# Patient Record
Sex: Female | Born: 1935 | Race: White | Hispanic: No | Marital: Married | Smoking: Never smoker
Health system: Southern US, Community
[De-identification: ages and names within clinical notes are randomized; demographics above are authoritative.]

## PROBLEM LIST (undated history)

## (undated) DIAGNOSIS — E119 Type 2 diabetes mellitus without complications: Secondary | ICD-10-CM

## (undated) DIAGNOSIS — E039 Hypothyroidism, unspecified: Secondary | ICD-10-CM

## (undated) DIAGNOSIS — F0280 Dementia in other diseases classified elsewhere without behavioral disturbance: Secondary | ICD-10-CM

## (undated) DIAGNOSIS — I10 Essential (primary) hypertension: Secondary | ICD-10-CM

## (undated) DIAGNOSIS — G309 Alzheimer's disease, unspecified: Secondary | ICD-10-CM

## (undated) DIAGNOSIS — F0390 Unspecified dementia without behavioral disturbance: Secondary | ICD-10-CM

## (undated) HISTORY — PX: PACEMAKER INSERTION: SHX728

## (undated) HISTORY — PX: BREAST SURGERY: SHX581

---

## 1987-07-21 HISTORY — PX: NEPHRECTOMY: SHX65

## 2012-09-17 ENCOUNTER — Encounter (HOSPITAL_COMMUNITY): Payer: Self-pay | Admitting: Emergency Medicine

## 2012-09-17 ENCOUNTER — Emergency Department (HOSPITAL_COMMUNITY): Payer: Medicare Other

## 2012-09-17 ENCOUNTER — Observation Stay (HOSPITAL_COMMUNITY)
Admission: EM | Admit: 2012-09-17 | Discharge: 2012-09-18 | Disposition: A | Discharge status: Skilled Nursing Facility | Payer: Medicare Other | Attending: Internal Medicine | Admitting: Internal Medicine

## 2012-09-17 DIAGNOSIS — E039 Hypothyroidism, unspecified: Secondary | ICD-10-CM | POA: Insufficient documentation

## 2012-09-17 DIAGNOSIS — Z95810 Presence of automatic (implantable) cardiac defibrillator: Secondary | ICD-10-CM | POA: Diagnosis present

## 2012-09-17 DIAGNOSIS — R0602 Shortness of breath: Secondary | ICD-10-CM | POA: Insufficient documentation

## 2012-09-17 DIAGNOSIS — E119 Type 2 diabetes mellitus without complications: Secondary | ICD-10-CM | POA: Insufficient documentation

## 2012-09-17 DIAGNOSIS — F0390 Unspecified dementia without behavioral disturbance: Secondary | ICD-10-CM | POA: Diagnosis present

## 2012-09-17 DIAGNOSIS — I10 Essential (primary) hypertension: Secondary | ICD-10-CM | POA: Diagnosis present

## 2012-09-17 DIAGNOSIS — R079 Chest pain, unspecified: Principal | ICD-10-CM | POA: Insufficient documentation

## 2012-09-17 DIAGNOSIS — Z950 Presence of cardiac pacemaker: Secondary | ICD-10-CM | POA: Insufficient documentation

## 2012-09-17 HISTORY — DX: Type 2 diabetes mellitus without complications: E11.9

## 2012-09-17 HISTORY — DX: Hypothyroidism, unspecified: E03.9

## 2012-09-17 HISTORY — DX: Essential (primary) hypertension: I10

## 2012-09-17 HISTORY — DX: Unspecified dementia, unspecified severity, without behavioral disturbance, psychotic disturbance, mood disturbance, and anxiety: F03.90

## 2012-09-17 LAB — CBC WITH DIFFERENTIAL/PLATELET
Basophils Absolute: 0.1 10*3/uL (ref 0.0–0.1)
Basophils Relative: 1 % (ref 0–1)
Eosinophils Relative: 2 % (ref 0–5)
HCT: 36.4 % (ref 36.0–46.0)
Hemoglobin: 12.4 g/dL (ref 12.0–15.0)
Lymphocytes Relative: 24 % (ref 12–46)
Lymphs Abs: 2.1 10*3/uL (ref 0.7–4.0)
MCV: 85.4 fL (ref 78.0–100.0)
Monocytes Absolute: 0.7 10*3/uL (ref 0.1–1.0)
Monocytes Relative: 9 % (ref 3–12)
Neutro Abs: 5.6 10*3/uL (ref 1.7–7.7)
WBC: 8.6 10*3/uL (ref 4.0–10.5)

## 2012-09-17 LAB — BASIC METABOLIC PANEL
BUN: 15 mg/dL (ref 6–23)
CO2: 23 mEq/L (ref 19–32)
Chloride: 107 mEq/L (ref 96–112)
GFR calc Af Amer: 45 mL/min — ABNORMAL LOW (ref >90)
Potassium: 3.7 mEq/L (ref 3.5–5.1)

## 2012-09-17 LAB — URINALYSIS, ROUTINE W REFLEX MICROSCOPIC
Bilirubin Urine: NEGATIVE
Hgb urine dipstick: NEGATIVE
Ketones, ur: NEGATIVE mg/dL
Nitrite: NEGATIVE
Protein, ur: NEGATIVE mg/dL
Urobilinogen, UA: 0.2 mg/dL (ref 0.0–1.0)

## 2012-09-17 LAB — TROPONIN I
Troponin I: <0.3 ng/mL (ref <0.30)
Troponin I: <0.3 ng/mL (ref <0.30)

## 2012-09-17 LAB — LIPID PANEL: Cholesterol: 173 mg/dL (ref 0–200)

## 2012-09-17 MED ORDER — NITROGLYCERIN 0.4 MG SL SUBL: 0.4000 mg | SUBLINGUAL_TABLET | SUBLINGUAL | Status: DC | PRN

## 2012-09-17 MED ORDER — SODIUM CHLORIDE 0.9 % IJ SOLN: 3.0000 mL | Freq: Two times a day (BID) | INTRAMUSCULAR | Status: DC

## 2012-09-17 MED ORDER — SODIUM CHLORIDE 0.9 % IV SOLN: 250.0000 mL | INTRAVENOUS | Status: DC | PRN

## 2012-09-17 MED ORDER — CLONAZEPAM 1 MG PO TABS
1.0000 mg | ORAL_TABLET | Freq: Every day | ORAL | Status: DC
  Administered 2012-09-17: 1 mg via ORAL
  Filled 2012-09-17: qty 1

## 2012-09-17 MED ORDER — MORPHINE SULFATE 2 MG/ML IJ SOLN: 2.0000 mg | INTRAMUSCULAR | Status: DC | PRN

## 2012-09-17 MED ORDER — INSULIN ASPART 100 UNIT/ML ~~LOC~~ SOLN
0.0000 [IU] | Freq: Three times a day (TID) | SUBCUTANEOUS | Status: DC
  Administered 2012-09-18 (×2): 2 [IU] via SUBCUTANEOUS

## 2012-09-17 MED ORDER — METOPROLOL TARTRATE 25 MG PO TABS
25.0000 mg | ORAL_TABLET | Freq: Two times a day (BID) | ORAL | Status: DC
  Administered 2012-09-17 – 2012-09-18 (×2): 25 mg via ORAL
  Filled 2012-09-17 (×3): qty 1

## 2012-09-17 MED ORDER — DONEPEZIL HCL 10 MG PO TABS
10.0000 mg | ORAL_TABLET | Freq: Every day | ORAL | Status: DC
  Administered 2012-09-17: 10 mg via ORAL
  Filled 2012-09-17 (×2): qty 1

## 2012-09-17 MED ORDER — INSULIN ASPART 100 UNIT/ML ~~LOC~~ SOLN: 0.0000 [IU] | SUBCUTANEOUS | Status: DC

## 2012-09-17 MED ORDER — PANTOPRAZOLE SODIUM 40 MG PO TBEC
40.0000 mg | DELAYED_RELEASE_TABLET | Freq: Every day | ORAL | Status: DC
  Administered 2012-09-18: 40 mg via ORAL
  Filled 2012-09-17: qty 1

## 2012-09-17 MED ORDER — MEMANTINE HCL 10 MG PO TABS
10.0000 mg | ORAL_TABLET | Freq: Two times a day (BID) | ORAL | Status: DC
  Administered 2012-09-17 – 2012-09-18 (×2): 10 mg via ORAL
  Filled 2012-09-17 (×3): qty 1

## 2012-09-17 MED ORDER — AMLODIPINE BESYLATE 5 MG PO TABS
5.0000 mg | ORAL_TABLET | Freq: Every day | ORAL | Status: DC
  Administered 2012-09-17 – 2012-09-18 (×2): 5 mg via ORAL
  Filled 2012-09-17 (×2): qty 1

## 2012-09-17 MED ORDER — INSULIN GLARGINE 100 UNIT/ML ~~LOC~~ SOLN
5.0000 [IU] | Freq: Every day | SUBCUTANEOUS | Status: DC
  Administered 2012-09-17: 5 [IU] via SUBCUTANEOUS

## 2012-09-17 MED ORDER — ASPIRIN EC 81 MG PO TBEC
81.0000 mg | DELAYED_RELEASE_TABLET | Freq: Every day | ORAL | Status: DC
  Administered 2012-09-17 – 2012-09-18 (×2): 81 mg via ORAL
  Filled 2012-09-17 (×2): qty 1

## 2012-09-17 MED ORDER — MAGNESIUM HYDROXIDE 400 MG/5ML PO SUSP: 30.0000 mL | Freq: Every day | ORAL | Status: DC | PRN

## 2012-09-17 MED ORDER — SODIUM CHLORIDE 0.9 % IJ SOLN
3.0000 mL | Freq: Two times a day (BID) | INTRAMUSCULAR | Status: DC
  Administered 2012-09-18: 3 mL via INTRAVENOUS

## 2012-09-17 MED ORDER — LEVOTHYROXINE SODIUM 100 MCG PO TABS
100.0000 ug | ORAL_TABLET | Freq: Every day | ORAL | Status: DC
  Administered 2012-09-18: 100 ug via ORAL
  Filled 2012-09-17 (×2): qty 1

## 2012-09-17 MED ORDER — SERTRALINE HCL 25 MG PO TABS
25.0000 mg | ORAL_TABLET | Freq: Every day | ORAL | Status: DC
  Administered 2012-09-18: 25 mg via ORAL
  Filled 2012-09-17: qty 1

## 2012-09-17 MED ORDER — RIVAROXABAN 20 MG PO TABS
20.0000 mg | ORAL_TABLET | Freq: Every day | ORAL | Status: DC
  Administered 2012-09-17: 20 mg via ORAL
  Filled 2012-09-17 (×2): qty 1

## 2012-09-17 MED ORDER — SODIUM CHLORIDE 0.9 % IJ SOLN: 3.0000 mL | INTRAMUSCULAR | Status: DC | PRN

## 2012-09-17 MED ORDER — ACETAMINOPHEN 500 MG PO TABS
500.0000 mg | ORAL_TABLET | ORAL | Status: DC | PRN
  Filled 2012-09-17: qty 1

## 2012-09-17 NOTE — ED Notes (Signed)
Pt presents to ED via ems with c/o left sided chest pain that started yesterday while eating. Pt took asa at nursing home that relieved chest pain. EMS placed 20g to left hand. Patient pain free after aspirin. NAD.

## 2012-09-17 NOTE — H&P (Signed)
Hospital Admission Note Date: 09/17/2012  Patient name: Sarah Yang Medical record number: [id number] Date of birth: 1934/10/20 Age: 78 y.o. Gender: female PCP: Dr. Cathey Endow Medical Service: Internal Medicine Teaching Service  Attending physician:  Dr. Criselda Peaches   1st Contact: Dr. Garald Braver   Pager:[phone] 2nd Contact: Dr. Clyde Lundborg    Pager:[phone] After 5 pm or weekends: 1st Contact:      Pager: [phone] 2nd Contact:      Pager: [phone]  Chief Complaint: Chest pain  History of Present Illness: Sarah Yang is a 78 year old woman with PMH of dementia, pacemaker implant for symptomatic bradycardia, DM2, HTN, history of breast cancer s/p bilateral mastectomy and on remission, history of recurrent LE DVT, and hypothyroidism who comes in for evaluation of chest pain. She is currently a nursing home resident with dementia and is unable to remember the details of event for today. Her daughter is at her bedside and provides most of the information during this interview. [name] was in her usual state of health until this morning, around 10 AM when she complained of sudden chest pain. Her chest pain was described as sharp, noted to be in her left chest, lasting for 10 minutes and reported as 10/10. There was no observed diaphoresis and Ms. Mahurin is unable to remember if the pain radiated to her arm, neck or jaw, or if she had shortness of breath or nausea associated with the pain. The pain subsided on its own. EMS was called to her facility, she was given ASA and brought to the [facility] ED for further evaluation. She denies recurrent chest pain. According to her daughter Sarah Yang has never had chest pain, coronary artery disease, or history of MI. She had recurrent syncope years ago which required pacemaker placement in 2012 with no symptomatic bradycardia since then.   Her Cardiologist id Dr. Jeanne Ivan in [facility]. Her former PCP of more than 40 years is Dr. Levora Angel. She has been seen by Dr. Cathey Endow recently  after moving into her nursing home facility in 07/29/2012.   Meds: Current Outpatient Rx  Name  Route  Sig  Dispense  Refill  . acetaminophen (TYLENOL) 500 MG tablet   Oral   Take 500 mg by mouth every 4 (four) hours as needed for pain (Max dose is 300 mg/24 hrs).         . ciprofloxacin (CIPRO) 250 MG tablet   Oral   Take 250 mg by mouth 2 (two) times daily. For 10 days started 09-08-12         . clonazePAM (KLONOPIN) 1 MG tablet   Oral   Take 1 mg by mouth at bedtime.         . donepezil (ARICEPT) 10 MG tablet   Oral   Take 10 mg by mouth at bedtime.         . insulin glargine (LANTUS) 100 UNIT/ML injection   Subcutaneous   Inject 5 Units into the skin at bedtime.         Marland Kitchen levothyroxine (SYNTHROID, LEVOTHROID) 100 MCG tablet   Oral   Take 100 mcg by mouth daily.         . magnesium hydroxide (MILK OF MAGNESIA) 400 MG/5ML suspension   Oral   Take 30 mLs by mouth daily as needed for constipation.         . memantine (NAMENDA) 10 MG tablet   Oral   Take 10 mg by mouth 2 (two) times daily.         Marland Kitchen  metoprolol tartrate (LOPRESSOR) 25 MG tablet   Oral   Take 25 mg by mouth 2 (two) times daily.         . Rivaroxaban (XARELTO) 20 MG TABS   Oral   Take 20 mg by mouth daily.         . sertraline (ZOLOFT) 25 MG tablet   Oral   Take 25 mg by mouth daily.           Allergies: Allergies as of 09/17/2012 - Review Complete 09/17/2012  Allergen Reaction Noted  . Latex Rash 09/17/2012   Past Medical History  Diagnosis Date  . Diabetes mellitus without complication   . Hypertension   . Dementia   . Hypothyroidism    Past Surgical History  Procedure Laterality Date  . Pacemaker insertion     No family history on file. History   Social History  . Marital Status: Married    Spouse Name: N/A    Number of Children: N/A  . Years of Education: N/A   Occupational History  . Not on file.   Social History Main Topics  . Smoking status: Never  Smoker   . Smokeless tobacco: Not on file  . Alcohol Use: No  . Drug Use: No  . Sexually Active: Not on file   Other Topics Concern  . Not on file   Social History Narrative  . No narrative on file    Review of Systems: Review of systems not obtained due to patient factors.  Physical Exam: Blood pressure 147/95, pulse 53, temperature 97.3 F (36.3 C), temperature source Oral, resp. rate 18, SpO2 100.00%. Vitals reviewed. General: resting in bed, in NAD, calm and cooperative to exam HEENT: PERRL, no scleral icterus Cardiac: Distant heart sounds secondary to body habitus (moderate kyphosis) no rubs, murmurs or gallops Pulm: clear to auscultation bilaterally, no wheezes, rales, or rhonchi MSK: Moderate kyphosis, genu varum, b/l knees with no effusion, no erythema, not TTP Abd: soft, nontender, nondistended, BS present Ext: warm and well perfused, no pedal edema. No calf pain, edema, or erythema b/l Neuro: alert oriented to person (her daughter), cranial nerves II-XII grossly intact, sensation to light touch equal in bilateral upper and lower extremities, moves 4 extremities spontaneously   Lab results: Basic Metabolic Panel:  Recent Labs  29/56/21 1127  NA 140  K 3.7  CL 107  CO2 23  GLUCOSE 175*  BUN 15  CREATININE 1.31*  CALCIUM 9.3   CBC:  Recent Labs  09/17/12 1127  WBC 8.6  NEUTROABS 5.6  HGB 12.4  HCT 36.4  MCV 85.4  PLT 172   Cardiac Enzymes:  Recent Labs  09/17/12 1132  TROPONINI <0.30   Imaging results:  Dg Chest 2 View  09/17/2012  *RADIOLOGY REPORT*  Clinical Data: Cough.  CHEST - 2 VIEW  Comparison: None.  Findings: Mild hyperinflation of the lungs.  Left pacer in place with leads in the right atrium and right ventricle.  Heart is normal size.  Lungs clear.  No effusions.  Diffuse degenerative changes throughout the thoracic spine with kyphosis.  No acute bony abnormality.  IMPRESSION: Mild hyperinflation.  No acute cardiopulmonary disease.    Original Report Authenticated By: Charlett Nose, M.D.     Other results: EKG: Atrial paced complexes, no ST elevation or depression.   Assessment & Plan by Problem:  Chest pain. Etiology unclear. Her risk factors for ACS are DM2, HTN, and age. Her TIMI score is 2 with 8% risk at  14 day for all cause mortality including from MI. Differential to include ACS (unlikely at this time with no hx of CAD, no EKG changes, and first troponin negative), PE (she does have hx of DVTs but she is on chronic anticoagulation therapy with [place], she does not have tachycardia), pacemaker firing (she denies recent bradycardia or syncope, troponin negative), GERD (she does have occasional heartburn but is not on chronic PPI). UTI could explain acute pain that could have been misinterpreted as CP given the patient's dementia and her family reports hx of UTI last week that was treated with Cipro with last dose of today.  -Observation status for MI rule out -ASA 81 mg daily -EKG in AM -troponin q6h -Risk factor stratification with LDL, Hgb A1C, and TSH -O2 supplementation for with goal saturation >92%   Recent UTI. Pt was diagnosed with UTI last week with cipro tx, last pill today.  -UA and urine culture  HTN. BP elevated today to 184/101, come down to 147/95. Pt received her Lopressor 25mg  in AM.  -Continue BB  DM2. She is on Lantus 5 units qHS, glipizide 10mg , and Novolog 3 units TID ac and SSI.  -Lantus 5 units  -SSI sensitive -Checking HgbA1C  Hypothyroidism.  -Continue home synthroid, daily -Check TSH  Dementia. Unclear if Alzeheimer's disease or vascular dementia. She is currently at SNF/memory care wing.  -Continue Namenda, Zoloft, and Aricept -Klonopin 1mg  qHS  History of recurrent bilateral DVT. No recent DVT, pt was on coumadin therapy in the past but now on Xeralto due to dementia.  -Continue Xeralto.  VTE prophylaxis: SCDs, on Xeralto  FEN:  NSL Replete as needed, goal K of 4, Mg  of 2 Carb mod heart healthy diet  Dispo: Disposition is deferred at this time, awaiting improvement of current medical problems. Anticipated discharge in approximately 1-2 day(s).   The patient has a current PCP Dr. Cathey Endow, therefore will not be requiring OPC follow-up after discharge.   The patient does not have transportation limitations that hinder transportation to clinic appointments.  Signed: Ky Barban 09/17/2012, 3:26 PM

## 2012-09-17 NOTE — ED Provider Notes (Signed)
History     CSN: [id number]  Arrival date & time 09/17/12  1056   First MD Initiated Contact with Patient 09/17/12 1114      Chief Complaint  Patient presents with  . Chest Pain     Patient is a 78 y.o. female presenting with chest pain. The history is provided by the EMS personnel, the nursing home and a relative. The history is limited by the condition of the patient (Hx dementia).  Chest Pain  Pt was seen at 1125.   Per EMS, NH report and family, c/o gradual onset and resolution of one episode of chest pain and SOB that began this morning approx 10am.  Pt was given ASA by the NH staff with relief of discomfort.  Pt has significant hx of dementia and does not recall events from this morning.     Past Medical History  Diagnosis Date  . Diabetes mellitus without complication   . Hypertension   . Dementia     Past Surgical History  Procedure Laterality Date  . Pacemaker insertion        History  Substance Use Topics  . Smoking status: Never Smoker   . Smokeless tobacco: Not on file  . Alcohol Use: No      Review of Systems  Unable to perform ROS: Dementia  Cardiovascular: Positive for chest pain.    Allergies  Latex  Home Medications  No current outpatient prescriptions on file.  BP 159/92  Pulse 88  Temp(Src) 97.3 F (36.3 C) (Oral)  Resp 16  SpO2 99%  Physical Exam 1130: Physical examination:  Nursing notes reviewed; Vital signs and O2 SAT reviewed;  Constitutional: Well developed, Well nourished, In no acute distress; Head:  Normocephalic, atraumatic; Eyes: EOMI, PERRL, No scleral icterus; ENMT: Mouth and pharynx normal, Mucous membranes dry; Neck: Supple, Full range of motion, No lymphadenopathy; Cardiovascular: Regular rate and rhythm, No gallop; Respiratory: Breath sounds clear & equal bilaterally, No rales, rhonchi, wheezes.  Speaking full sentences with ease, Normal respiratory effort/excursion; Chest: Nontender, Movement normal; Abdomen: Soft,  Nontender, Nondistended, Normal bowel sounds;; Extremities: Pulses normal, No tenderness, No edema, No calf edema or asymmetry.; Neuro: Awake, alert, confused re: time, place, events per hx dementia. Major CN grossly intact. No facial droop. Speech clear. Moves all ext spontaneously without apparent gross focal motor deficits.; Skin: Color normal, Warm, Dry.   ED Course  Procedures   1130:  Pt's family is at bedside. They state that they work at the NH and pt did not have CP yesterday.  State the CP began this morning, they were told about it, and had NH transport pt to the ED today.   MDM  MDM Reviewed: previous chart, nursing note and vitals Reviewed previous: labs Interpretation: ECG, labs and x-ray    Date: 09/17/2012  Rate: 84  Rhythm: normal sinus rhythm, atrial paced complexes  QRS Axis: normal  Intervals: normal  ST/T Wave abnormalities: normal  Conduction Disutrbances:none  Narrative Interpretation:   Old EKG Reviewed: none available    Results for orders placed during the hospital encounter of 09/17/12  BASIC METABOLIC PANEL      Result Value Range   Sodium 140  135 - 145 mEq/L   Potassium 3.7  3.5 - 5.1 mEq/L   Chloride 107  96 - 112 mEq/L   CO2 23  19 - 32 mEq/L   Glucose, Bld 175 (*) 70 - 99 mg/dL   BUN 15  6 - 23 mg/dL  Creatinine, Ser 1.31 (*) 0.50 - 1.10 mg/dL   Calcium 9.3  8.4 - 16.1 mg/dL   GFR calc non Af Amer 39 (*) >90 mL/min   GFR calc Af Amer 45 (*) >90 mL/min  CBC WITH DIFFERENTIAL      Result Value Range   WBC 8.6  4.0 - 10.5 K/uL   RBC 4.26  3.87 - 5.11 MIL/uL   Hemoglobin 12.4  12.0 - 15.0 g/dL   HCT 09.6  04.5 - 40.9 %   MCV 85.4  78.0 - 100.0 fL   MCH 29.1  26.0 - 34.0 pg   MCHC 34.1  30.0 - 36.0 g/dL   RDW 81.1  91.4 - 78.2 %   Platelets 172  150 - 400 K/uL   Neutrophils Relative 65  43 - 77 %   Neutro Abs 5.6  1.7 - 7.7 K/uL   Lymphocytes Relative 24  12 - 46 %   Lymphs Abs 2.1  0.7 - 4.0 K/uL   Monocytes Relative 9  3 - 12 %    Monocytes Absolute 0.7  0.1 - 1.0 K/uL   Eosinophils Relative 2  0 - 5 %   Eosinophils Absolute 0.1  0.0 - 0.7 K/uL   Basophils Relative 1  0 - 1 %   Basophils Absolute 0.1  0.0 - 0.1 K/uL  TROPONIN I      Result Value Range   Troponin I <0.30  <0.30 ng/mL   Dg Chest 2 View 09/17/2012  *RADIOLOGY REPORT*  Clinical Data: Cough.  CHEST - 2 VIEW  Comparison: None.  Findings: Mild hyperinflation of the lungs.  Left pacer in place with leads in the right atrium and right ventricle.  Heart is normal size.  Lungs clear.  No effusions.  Diffuse degenerative changes throughout the thoracic spine with kyphosis.  No acute bony abnormality.  IMPRESSION: Mild hyperinflation.  No acute cardiopulmonary disease.   Original Report Authenticated By: Charlett Nose, M.D.     1415:  Pt continues to deny CP while in the ED.  Multiple cardiac risk factors, will observation admit. Dx and testing d/w pt and family.  Questions answered.  Verb understanding, agreeable to observation admit. T/C to [facility] Resident, case discussed, including:  HPI, pertinent PM/SHx, VS/PE, dx testing, ED course and treatment:  Agreeable to observation admit, requests they will come to ED for eval.          Laray Anger, DO 09/20/12 1337

## 2012-09-17 NOTE — ED Notes (Signed)
Pt assisted to bedside commode. Placed back on cardiac monitor and continuous pulse ox.

## 2012-09-18 DIAGNOSIS — R079 Chest pain, unspecified: Secondary | ICD-10-CM

## 2012-09-18 LAB — GLUCOSE, CAPILLARY
Glucose-Capillary: 153 mg/dL — ABNORMAL HIGH (ref 70–99)
Glucose-Capillary: 199 mg/dL — ABNORMAL HIGH (ref 70–99)

## 2012-09-18 LAB — COMPREHENSIVE METABOLIC PANEL
ALT: 9 U/L (ref 0–35)
Alkaline Phosphatase: 51 U/L (ref 39–117)
Chloride: 106 mEq/L (ref 96–112)
GFR calc Af Amer: 48 mL/min — ABNORMAL LOW (ref >90)
Glucose, Bld: 121 mg/dL — ABNORMAL HIGH (ref 70–99)
Potassium: 3.7 mEq/L (ref 3.5–5.1)
Sodium: 140 mEq/L (ref 135–145)
Total Bilirubin: 0.7 mg/dL (ref 0.3–1.2)
Total Protein: 6.4 g/dL (ref 6.0–8.3)

## 2012-09-18 LAB — TSH: TSH: 5.258 u[IU]/mL — ABNORMAL HIGH (ref 0.350–4.500)

## 2012-09-18 LAB — URINE CULTURE

## 2012-09-18 LAB — HEMOGLOBIN A1C
Hgb A1c MFr Bld: 7.2 % — ABNORMAL HIGH (ref <5.7)
Mean Plasma Glucose: 160 mg/dL — ABNORMAL HIGH (ref <117)

## 2012-09-18 LAB — TROPONIN I
Troponin I: <0.3 ng/mL (ref <0.30)
Troponin I: <0.3 ng/mL (ref <0.30)

## 2012-09-18 LAB — CBC
Hemoglobin: 12.6 g/dL (ref 12.0–15.0)
MCHC: 33.8 g/dL (ref 30.0–36.0)
RBC: 4.38 MIL/uL (ref 3.87–5.11)
WBC: 8 10*3/uL (ref 4.0–10.5)

## 2012-09-18 MED ORDER — ASPIRIN 81 MG PO TBEC: 81.0000 mg | DELAYED_RELEASE_TABLET | Freq: Every day | ORAL | Status: DC

## 2012-09-18 MED ORDER — PANTOPRAZOLE SODIUM 40 MG PO TBEC: 40.0000 mg | DELAYED_RELEASE_TABLET | Freq: Every day | ORAL | Status: DC

## 2012-09-18 MED ORDER — NITROGLYCERIN 0.4 MG SL SUBL: 0.4000 mg | SUBLINGUAL_TABLET | SUBLINGUAL | Status: DC | PRN

## 2012-09-18 NOTE — Discharge Summary (Signed)
Internal Medicine Teaching [facility] Discharge Note  Name: Sarah Yang MRN: [id number] DOB: Apr 14, 1935 78 y.o.  Date of Admission: 09/17/2012 10:56 AM Date of Discharge: 09/18/2012 Attending Physician: Inez Catalina, MD  Discharge Diagnosis: Principal Problem:   Chest pain Active Problems:   Dementia   DM2 (diabetes mellitus, type 2)   Hypertension   Hypothyroidism   Cardiac pacemaker   Discharge Medications:   Medication List    STOP taking these medications       glipiZIDE 10 MG tablet  Commonly known as:  GLUCOTROL     NOVOLIN R 100 units/mL injection  Generic drug:  insulin regular      TAKE these medications       acetaminophen 500 MG tablet  Commonly known as:  TYLENOL  Take 500 mg by mouth every 4 (four) hours as needed for pain (Max dose is 300 mg/24 hrs).     aspirin 81 MG EC tablet  Take 1 tablet (81 mg total) by mouth daily.     ciprofloxacin 250 MG tablet  Commonly known as:  CIPRO  Take 250 mg by mouth 2 (two) times daily. For 10 days started 09-08-12     clonazePAM 1 MG tablet  Commonly known as:  KLONOPIN  Take 1 mg by mouth at bedtime.     donepezil 10 MG tablet  Commonly known as:  ARICEPT  Take 10 mg by mouth at bedtime.     insulin glargine 100 UNIT/ML injection  Commonly known as:  LANTUS  Inject 5 Units into the skin at bedtime.     levothyroxine 100 MCG tablet  Commonly known as:  SYNTHROID, LEVOTHROID  Take 100 mcg by mouth daily.     magnesium hydroxide 400 MG/5ML suspension  Commonly known as:  MILK OF MAGNESIA  Take 30 mLs by mouth daily as needed for constipation.     memantine 10 MG tablet  Commonly known as:  NAMENDA  Take 10 mg by mouth 2 (two) times daily.     metoprolol tartrate 25 MG tablet  Commonly known as:  LOPRESSOR  Take 25 mg by mouth 2 (two) times daily.     nitroGLYCERIN 0.4 MG SL tablet  Commonly known as:  NITROSTAT  Place 1 tablet (0.4 mg total) under the tongue every 5 (five) minutes as  needed for chest pain.     pantoprazole 40 MG tablet  Commonly known as:  PROTONIX  Take 1 tablet (40 mg total) by mouth daily at 12 noon.     sertraline 25 MG tablet  Commonly known as:  ZOLOFT  Take 25 mg by mouth daily.     XARELTO 20 MG Tabs  Generic drug:  Rivaroxaban  Take 20 mg by mouth daily.        Disposition and follow-up:   Sarah Yang was discharged from [facility] in Good condition.  At the hospital follow up visit please address: -Repeat TSH, she may need medication adjustment -She will need follow up with her Cardiologist for pacemaker interrogation--her family was not sure of the name of the manufacturer for her pacemaker -She may need repeat BP checks and another anti-hypertensive -Would consider decreasing dose of Xeralto to 15mg  daily based on Cr Cl of 15-46ml/min.  Follow-up Appointments: Follow-up Information   Follow up with With your PCP as soon as possible.      Schedule an appointment as soon as possible for a visit with Sandy Salaam, MD. (For further  evaluation of chest pain and for pacemaker interrogation)    Contact information:   [address] [place] [address] [phone]      Discharge Orders   Future Orders Complete By Expires     Diet - low sodium heart healthy  As directed     Increase activity slowly  As directed        Consultations:  None  Procedures Performed:  Dg Chest 2 View  09/17/2012  *RADIOLOGY REPORT*  Clinical Data: Cough.  CHEST - 2 VIEW  Comparison: None.  Findings: Mild hyperinflation of the lungs.  Left pacer in place with leads in the right atrium and right ventricle.  Heart is normal size.  Lungs clear.  No effusions.  Diffuse degenerative changes throughout the thoracic spine with kyphosis.  No acute bony abnormality.  IMPRESSION: Mild hyperinflation.  No acute cardiopulmonary disease.   Original Report Authenticated By: Charlett Nose, M.D.     Admission HPI:  Sarah Yang  is a 78 year old woman with PMH of dementia, pacemaker implant for symptomatic bradycardia, DM2, HTN, history of breast cancer s/p bilateral mastectomy and on remission, history of recurrent LE DVT, and hypothyroidism who comes in for evaluation of chest pain. She is currently a nursing home resident with dementia and is unable to remember the details of event for today. Her daughter is at her bedside and provides most of the information during this interview. Sarah Yang was in her usual state of health until this morning, around 10 AM when she complained of sudden chest pain. Her chest pain was described as sharp, noted to be in her left chest, lasting for 10 minutes and reported as 10/10. There was no observed diaphoresis and Sarah Yang is unable to remember if the pain radiated to her arm, neck or jaw, or if she had shortness of breath or nausea associated with the pain. The pain subsided on its own. EMS was called to her facility, she was given ASA and brought to the [facility] ED for further evaluation. She denies recurrent chest pain. According to her daughter, Sarah Yang has never had chest pain, coronary artery disease, or history of MI. She had recurrent syncope years ago which required pacemaker placement in 2012 with no symptomatic bradycardia since then.  Her Cardiologist is Dr. Jeanne Ivan in [facility]. Her former PCP of more than 40 years is Dr. Levora Angel. She has been seen by Dr. Cathey Endow recently after moving into her nursing home facility in 07/29/2012.   Hospital Course by problem list: Atypical Chest pain. She had one episode of acute chest pain on presentation and was placed in inpatient observation overnight for ACS rule out. Her troponin cycled every 6 hours times 3 is negative. Her initial EKG had atrial pacing with no ST/T wave abnormalities. Repeat EKG with nonspecific T wave changes not consistent with MI. Her chest pain has resolved on it sown with no recurrent chest pain overnight. The likely etiology  for her chest pain is GERD but could also be secondary to anxiety or coronary vasospasm. Her LDL of 88, meets the goal of <100. Her daughter was encouraged to make an outpatient follow up appointment with Cardiology for further evaluation of chest pain and for pacemaker interrogation--pacemaker manufacturer unknown to the patient and her daughter, interrogation could not be done while inpatient but hopefully her Cardiologist has this information.   Recent UTI. She was diagnosed with UTI last week with ciprofloxacin treatment for 10 days and  finished the course on 09/17/12. Her UA is leukoesterase negative and nitrite negative. Urine culture pending upon her discharge. She denies dysuria or increased frequency.   HTN. BP elevated mildly elevated to 143/62 this morning. She was briefly treated with Norvasc 5mg  in addition to Lopressor. She may need continuation of Norvasc as outpatient if her blood pressure is persistentally elevated. We will continue Lopressor upon her discharge.   DM2. She is on Lantus 5 units qHS, glipizide 10mg , and Novolog 3 units TID ac and SSI. HgbA1C of 7.2%, DM2 not at goal of below 7% but controlled. She will resume her home regimen upon her discharge.    Hypothyroidism. TSH only mildly elevated to 5.258. Continue home synthroid, daily. She may need mild recheck TSH as outpatient and medication adjustment.   Dementia. Unclear if Alzeheimer's disease or vascular dementia. She is currently at SNF/memory care wing. We continued her Namenda, Zoloft, and Aricept as well as Klonopin 1mg  qHS. She will resume these medications upon her discharge.    History of recurrent bilateral DVT. No recent DVT. She was on coumadin therapy in the past but is now on Xeralto due to dementia. We continued Xeralto 20mg  daily but the dose of this medication could be adjusted to 15mg  daily in the outpatient setting based on her Creatinine Clearance of 15-61ml/min.   Discharge Vitals:  BP 143/62   Pulse 86  Temp(Src) 97.7 F (36.5 C) (Oral)  Resp 16  Ht 5\' 5"  (1.651 m)  Wt 131 lb 11.2 oz (59.739 kg)  BMI 21.92 kg/m2  SpO2 100%  Discharge Labs:  Results for orders placed during the hospital encounter of 09/17/12 (from the past 24 hour(s))  URINALYSIS, ROUTINE W REFLEX MICROSCOPIC     Status: Abnormal   Collection Time    09/17/12  4:51 PM      Result Value Range   Color, Urine YELLOW  YELLOW   APPearance CLEAR  CLEAR   Specific Gravity, Urine 1.019  1.005 - 1.030   pH 5.5  5.0 - 8.0   Glucose, UA 100 (*) NEGATIVE mg/dL   Hgb urine dipstick NEGATIVE  NEGATIVE   Bilirubin Urine NEGATIVE  NEGATIVE   Ketones, ur NEGATIVE  NEGATIVE mg/dL   Protein, ur NEGATIVE  NEGATIVE mg/dL   Urobilinogen, UA 0.2  0.0 - 1.0 mg/dL   Nitrite NEGATIVE  NEGATIVE   Leukocytes, UA NEGATIVE  NEGATIVE  GLUCOSE, CAPILLARY     Status: Abnormal   Collection Time    09/17/12  5:04 PM      Result Value Range   Glucose-Capillary 117 (*) 70 - 99 mg/dL   Comment 1 Notify RN    LIPID PANEL     Status: Abnormal   Collection Time    09/17/12  5:05 PM      Result Value Range   Cholesterol 173  0 - 200 mg/dL   Triglycerides 295 (*) <150 mg/dL   HDL 44  >62 mg/dL   Total CHOL/HDL Ratio 3.9     VLDL 41 (*) 0 - 40 mg/dL   LDL Cholesterol 88  0 - 99 mg/dL  HEMOGLOBIN Z3Y     Status: Abnormal   Collection Time    09/17/12  5:05 PM      Result Value Range   Hemoglobin A1C 7.2 (*) <5.7 %   Mean Plasma Glucose 160 (*) <117 mg/dL  TSH     Status: Abnormal   Collection Time    09/17/12  5:05  PM      Result Value Range   TSH 5.258 (*) 0.350 - 4.500 uIU/mL  TROPONIN I     Status: None   Collection Time    09/17/12  5:05 PM      Result Value Range   Troponin I <0.30  <0.30 ng/mL  MRSA PCR SCREENING     Status: None   Collection Time    09/17/12  5:54 PM      Result Value Range   MRSA by PCR NEGATIVE  NEGATIVE  GLUCOSE, CAPILLARY     Status: Abnormal   Collection Time    09/17/12  8:27 PM       Result Value Range   Glucose-Capillary 183 (*) 70 - 99 mg/dL  TROPONIN I     Status: None   Collection Time    09/17/12 11:47 PM      Result Value Range   Troponin I <0.30  <0.30 ng/mL  TROPONIN I     Status: None   Collection Time    09/18/12  5:36 AM      Result Value Range   Troponin I <0.30  <0.30 ng/mL  CBC     Status: None   Collection Time    09/18/12  5:36 AM      Result Value Range   WBC 8.0  4.0 - 10.5 K/uL   RBC 4.38  3.87 - 5.11 MIL/uL   Hemoglobin 12.6  12.0 - 15.0 g/dL   HCT 45.4  09.8 - 11.9 %   MCV 85.2  78.0 - 100.0 fL   MCH 28.8  26.0 - 34.0 pg   MCHC 33.8  30.0 - 36.0 g/dL   RDW 14.7  82.9 - 56.2 %   Platelets 185  150 - 400 K/uL  COMPREHENSIVE METABOLIC PANEL     Status: Abnormal   Collection Time    09/18/12  5:36 AM      Result Value Range   Sodium 140  135 - 145 mEq/L   Potassium 3.7  3.5 - 5.1 mEq/L   Chloride 106  96 - 112 mEq/L   CO2 24  19 - 32 mEq/L   Glucose, Bld 121 (*) 70 - 99 mg/dL   BUN 15  6 - 23 mg/dL   Creatinine, Ser 1.30 (*) 0.50 - 1.10 mg/dL   Calcium 9.9  8.4 - 86.5 mg/dL   Total Protein 6.4  6.0 - 8.3 g/dL   Albumin 3.4 (*) 3.5 - 5.2 g/dL   AST 14  0 - 37 U/L   ALT 9  0 - 35 U/L   Alkaline Phosphatase 51  39 - 117 U/L   Total Bilirubin 0.7  0.3 - 1.2 mg/dL   GFR calc non Af Amer 41 (*) >90 mL/min   GFR calc Af Amer 48 (*) >90 mL/min  GLUCOSE, CAPILLARY     Status: Abnormal   Collection Time    09/18/12  7:35 AM      Result Value Range   Glucose-Capillary 153 (*) 70 - 99 mg/dL   Comment 1 Documented in Chart     Comment 2 Notify RN    GLUCOSE, CAPILLARY     Status: Abnormal   Collection Time    09/18/12 11:44 AM      Result Value Range   Glucose-Capillary 199 (*) 70 - 99 mg/dL   Comment 1 Documented in Chart     Comment 2 Notify RN      Signed:  Sara Chu D 09/18/2012, 1:33 PM   Time Spent on Discharge: 35 minutes Services Ordered on Discharge: None, patient to return to SNF Equipment Ordered on  Discharge: None

## 2012-09-18 NOTE — Progress Notes (Signed)
Entered in error

## 2012-09-18 NOTE — H&P (Signed)
Internal Medicine Teaching Service Attending Note Date: 09/18/2012  Patient name: Sarah Yang  Medical record number: [id number]  Date of birth: 01/19/1935   CC: Chest pain  I have seen and evaluated Sarah Yang and discussed their care with the Residency Team.    Sarah Yang is a 78yo woman with PMH of dementia, PCM for symptomatic bradycardia, DM, HTN, B/L DVT and hypothyroidism who presented for chest pain.  Sarah Yang is a resident of a NH.  She is oriented to person only when I talked to her and she became angry at further orientation questions.  She reports remembering the chest pain event and reports that it "came out of nowhere."  She was sitting down when the pain presented before breakfast.  She reported no diaphoresis or nausea.  Per chart review, it appears the pain was present for about 10 minutes and resolved on its own.  EMS was called by the facility.  She was given aspirin and presented to the ED.  She does not currently have chest pain.    When re-visiting the patient, her daughter was present and added to the history some symptoms of reflux including frequent burping/belching.   Physical Exam: Blood pressure 143/62, pulse 86, temperature 97.7 F (36.5 C), temperature source Oral, resp. rate 16, height 5\' 5"  (1.651 m), weight 131 lb 11.2 oz (59.739 kg), SpO2 100.00%. General appearance: alert, appears stated age and oriented to person only Head: Normocephalic, without obvious abnormality, atraumatic Eyes: EOMi, anicteric sclerae Lungs: clear to auscultation bilaterally Heart: RR, NR, no murmur noted Extremities: + kyphosis, warm, well perfused, no calf pain  Lab results: Results for orders placed during the hospital encounter of 09/17/12 (from the past 24 hour(s))  BASIC METABOLIC PANEL     Status: Abnormal   Collection Time    09/17/12 11:27 AM      Result Value Range   Sodium 140  135 - 145 mEq/L   Potassium 3.7  3.5 - 5.1 mEq/L   Chloride 107  96 - 112  mEq/L   CO2 23  19 - 32 mEq/L   Glucose, Bld 175 (*) 70 - 99 mg/dL   BUN 15  6 - 23 mg/dL   Creatinine, Ser 5.62 (*) 0.50 - 1.10 mg/dL   Calcium 9.3  8.4 - 13.0 mg/dL   GFR calc non Af Amer 39 (*) >90 mL/min   GFR calc Af Amer 45 (*) >90 mL/min  CBC WITH DIFFERENTIAL     Status: None   Collection Time    09/17/12 11:27 AM      Result Value Range   WBC 8.6  4.0 - 10.5 K/uL   RBC 4.26  3.87 - 5.11 MIL/uL   Hemoglobin 12.4  12.0 - 15.0 g/dL   HCT 86.5  78.4 - 69.6 %   MCV 85.4  78.0 - 100.0 fL   MCH 29.1  26.0 - 34.0 pg   MCHC 34.1  30.0 - 36.0 g/dL   RDW 29.5  28.4 - 13.2 %   Platelets 172  150 - 400 K/uL   Neutrophils Relative 65  43 - 77 %   Neutro Abs 5.6  1.7 - 7.7 K/uL   Lymphocytes Relative 24  12 - 46 %   Lymphs Abs 2.1  0.7 - 4.0 K/uL   Monocytes Relative 9  3 - 12 %   Monocytes Absolute 0.7  0.1 - 1.0 K/uL   Eosinophils Relative 2  0 - 5 %  Eosinophils Absolute 0.1  0.0 - 0.7 K/uL   Basophils Relative 1  0 - 1 %   Basophils Absolute 0.1  0.0 - 0.1 K/uL  TROPONIN I     Status: None   Collection Time    09/17/12 11:32 AM      Result Value Range   Troponin I <0.30  <0.30 ng/mL  URINALYSIS, ROUTINE W REFLEX MICROSCOPIC     Status: Abnormal   Collection Time    09/17/12  4:51 PM      Result Value Range   Color, Urine YELLOW  YELLOW   APPearance CLEAR  CLEAR   Specific Gravity, Urine 1.019  1.005 - 1.030   pH 5.5  5.0 - 8.0   Glucose, UA 100 (*) NEGATIVE mg/dL   Hgb urine dipstick NEGATIVE  NEGATIVE   Bilirubin Urine NEGATIVE  NEGATIVE   Ketones, ur NEGATIVE  NEGATIVE mg/dL   Protein, ur NEGATIVE  NEGATIVE mg/dL   Urobilinogen, UA 0.2  0.0 - 1.0 mg/dL   Nitrite NEGATIVE  NEGATIVE   Leukocytes, UA NEGATIVE  NEGATIVE  GLUCOSE, CAPILLARY     Status: Abnormal   Collection Time    09/17/12  5:04 PM      Result Value Range   Glucose-Capillary 117 (*) 70 - 99 mg/dL   Comment 1 Notify RN    LIPID PANEL     Status: Abnormal   Collection Time    09/17/12  5:05 PM       Result Value Range   Cholesterol 173  0 - 200 mg/dL   Triglycerides 010 (*) <150 mg/dL   HDL 44  >27 mg/dL   Total CHOL/HDL Ratio 3.9     VLDL 41 (*) 0 - 40 mg/dL   LDL Cholesterol 88  0 - 99 mg/dL  HEMOGLOBIN O5D     Status: Abnormal   Collection Time    09/17/12  5:05 PM      Result Value Range   Hemoglobin A1C 7.2 (*) <5.7 %   Mean Plasma Glucose 160 (*) <117 mg/dL  TSH     Status: Abnormal   Collection Time    09/17/12  5:05 PM      Result Value Range   TSH 5.258 (*) 0.350 - 4.500 uIU/mL  TROPONIN I     Status: None   Collection Time    09/17/12  5:05 PM      Result Value Range   Troponin I <0.30  <0.30 ng/mL  MRSA PCR SCREENING     Status: None   Collection Time    09/17/12  5:54 PM      Result Value Range   MRSA by PCR NEGATIVE  NEGATIVE  GLUCOSE, CAPILLARY     Status: Abnormal   Collection Time    09/17/12  8:27 PM      Result Value Range   Glucose-Capillary 183 (*) 70 - 99 mg/dL  TROPONIN I     Status: None   Collection Time    09/17/12 11:47 PM      Result Value Range   Troponin I <0.30  <0.30 ng/mL  TROPONIN I     Status: None   Collection Time    09/18/12  5:36 AM      Result Value Range   Troponin I <0.30  <0.30 ng/mL  CBC     Status: None   Collection Time    09/18/12  5:36 AM      Result Value Range  WBC 8.0  4.0 - 10.5 K/uL   RBC 4.38  3.87 - 5.11 MIL/uL   Hemoglobin 12.6  12.0 - 15.0 g/dL   HCT 96.0  45.4 - 09.8 %   MCV 85.2  78.0 - 100.0 fL   MCH 28.8  26.0 - 34.0 pg   MCHC 33.8  30.0 - 36.0 g/dL   RDW 11.9  14.7 - 82.9 %   Platelets 185  150 - 400 K/uL  COMPREHENSIVE METABOLIC PANEL     Status: Abnormal   Collection Time    09/18/12  5:36 AM      Result Value Range   Sodium 140  135 - 145 mEq/L   Potassium 3.7  3.5 - 5.1 mEq/L   Chloride 106  96 - 112 mEq/L   CO2 24  19 - 32 mEq/L   Glucose, Bld 121 (*) 70 - 99 mg/dL   BUN 15  6 - 23 mg/dL   Creatinine, Ser 5.62 (*) 0.50 - 1.10 mg/dL   Calcium 9.9  8.4 - 13.0 mg/dL   Total  Protein 6.4  6.0 - 8.3 g/dL   Albumin 3.4 (*) 3.5 - 5.2 g/dL   AST 14  0 - 37 U/L   ALT 9  0 - 35 U/L   Alkaline Phosphatase 51  39 - 117 U/L   Total Bilirubin 0.7  0.3 - 1.2 mg/dL   GFR calc non Af Amer 41 (*) >90 mL/min   GFR calc Af Amer 48 (*) >90 mL/min  GLUCOSE, CAPILLARY     Status: Abnormal   Collection Time    09/18/12  7:35 AM      Result Value Range   Glucose-Capillary 153 (*) 70 - 99 mg/dL   Comment 1 Documented in Chart     Comment 2 Notify RN      Imaging results:  Dg Chest 2 View  09/17/2012  *RADIOLOGY REPORT*  Clinical Data: Cough.  CHEST - 2 VIEW  Comparison: None.  Findings: Mild hyperinflation of the lungs.  Left pacer in place with leads in the right atrium and right ventricle.  Heart is normal size.  Lungs clear.  No effusions.  Diffuse degenerative changes throughout the thoracic spine with kyphosis.  No acute bony abnormality.  IMPRESSION: Mild hyperinflation.  No acute cardiopulmonary disease.   Original Report Authenticated By: Charlett Nose, M.D.     Assessment and Plan: I agree with the formulated Assessment and Plan with the following changes:   1. Atypical chest pain - tnt X 3 negative - Pain resolved without further issue - EKG penidng this AM - No telemetry events - Likely causes could be GERD vs. Coronary vasospasm vs. Anxiety.  Will treat with low dose PPI in the short term to evaluate for improvement in GERD symptoms  Other issues as per resident note.     Inez Catalina, MD 3/2/20149:53 AM

## 2012-09-18 NOTE — Progress Notes (Signed)
Subjective: She is feeling well today with no chest pain overnight. Her daughter is at her bedside and reassures that Sarah Yang is at her baseline with no chest pain or overnight events.    Objective: Vital signs in last 24 hours: Filed Vitals:   09/17/12 2100 09/18/12 0500 09/18/12 0934 09/18/12 0936  BP: 150/72 119/77 143/62   Pulse: 79 80  86  Temp: 97.6 F (36.4 C) 97.7 F (36.5 C)    TempSrc:      Resp: 18 16    Height:      Weight:  131 lb 11.2 oz (59.739 kg)    SpO2: 99% 100%     Weight change:   Intake/Output Summary (Last 24 hours) at 09/18/12 1312 Last data filed at 09/18/12 0949  Gross per 24 hour  Intake    360 ml  Output    800 ml  Net   -440 ml   Vitals reviewed.  General: resting in bed, in NAD, calm and cooperative to exam  HEENT: PERRL, no scleral icterus  Cardiac: Distant heart sounds secondary to body habitus (moderate kyphosis) no rubs, murmurs or gallops  Pulm: clear to auscultation bilaterally, no wheezes, rales, or rhonchi  MSK: Moderate kyphosis, genu varum, b/l knees with no effusion, no erythema, not TTP  Abd: soft, nontender, nondistended, BS present  Ext: warm and well perfused, no pedal edema. No calf pain, edema, or erythema b/l  Neuro: alert oriented to person (her daughter), cranial nerves II-XII grossly intact, sensation to light touch equal in bilateral upper and lower extremities, moves 4 extremities spontaneously  Lab Results: Basic Metabolic Panel:  Recent Labs Lab 09/17/12 1127 09/18/12 0536  NA 140 140  K 3.7 3.7  CL 107 106  CO2 23 24  GLUCOSE 175* 121*  BUN 15 15  CREATININE 1.31* 1.24*  CALCIUM 9.3 9.9   Liver Function Tests:  Recent Labs Lab 09/18/12 0536  AST 14  ALT 9  ALKPHOS 51  BILITOT 0.7  PROT 6.4  ALBUMIN 3.4*   CBC:  Recent Labs Lab 09/17/12 1127 09/18/12 0536  WBC 8.6 8.0  NEUTROABS 5.6  --   HGB 12.4 12.6  HCT 36.4 37.3  MCV 85.4 85.2  PLT 172 185   Cardiac Enzymes:  Recent  Labs Lab 09/17/12 1705 09/17/12 2347 09/18/12 0536  TROPONINI <0.30 <0.30 <0.30   CBG:  Recent Labs Lab 09/17/12 1704 09/17/12 2027 09/18/12 0735 09/18/12 1144  GLUCAP 117* 183* 153* 199*   Hemoglobin A1C:  Recent Labs Lab 09/17/12 1705  HGBA1C 7.2*   Fasting Lipid Panel:  Recent Labs Lab 09/17/12 1705  CHOL 173  HDL 44  LDLCALC 88  TRIG 205*  CHOLHDL 3.9   Thyroid Function Tests:  Recent Labs Lab 09/17/12 1705  TSH 5.258*   Urinalysis:  Recent Labs Lab 09/17/12 1651  COLORURINE YELLOW  LABSPEC 1.019  PHURINE 5.5  GLUCOSEU 100*  HGBUR NEGATIVE  BILIRUBINUR NEGATIVE  KETONESUR NEGATIVE  PROTEINUR NEGATIVE  UROBILINOGEN 0.2  NITRITE NEGATIVE  LEUKOCYTESUR NEGATIVE    Micro Results: Recent Results (from the past 240 hour(s))  MRSA PCR SCREENING     Status: None   Collection Time    09/17/12  5:54 PM      Result Value Range Status   MRSA by PCR NEGATIVE  NEGATIVE Final   Comment:            The GeneXpert MRSA Assay (FDA     approved for NASAL specimens  only), is one component of a     comprehensive MRSA colonization     surveillance program. It is not     intended to diagnose MRSA     infection nor to guide or     monitor treatment for     MRSA infections.   Studies/Results: Dg Chest 2 View  09/17/2012  *RADIOLOGY REPORT*  Clinical Data: Cough.  CHEST - 2 VIEW  Comparison: None.  Findings: Mild hyperinflation of the lungs.  Left pacer in place with leads in the right atrium and right ventricle.  Heart is normal size.  Lungs clear.  No effusions.  Diffuse degenerative changes throughout the thoracic spine with kyphosis.  No acute bony abnormality.  IMPRESSION: Mild hyperinflation.  No acute cardiopulmonary disease.   Original Report Authenticated By: Charlett Nose, M.D.    Medications: I have reviewed the patient's current medications. Scheduled Meds: . amLODipine  5 mg Oral Daily  . aspirin EC  81 mg Oral Daily  . clonazePAM  1 mg  Oral QHS  . donepezil  10 mg Oral QHS  . insulin aspart  0-9 Units Subcutaneous TID WC  . insulin glargine  5 Units Subcutaneous QHS  . levothyroxine  100 mcg Oral QAC breakfast  . memantine  10 mg Oral BID  . metoprolol tartrate  25 mg Oral BID  . pantoprazole  40 mg Oral Q1200  . Rivaroxaban  20 mg Oral Q supper  . sertraline  25 mg Oral Daily  . sodium chloride  3 mL Intravenous Q12H  . sodium chloride  3 mL Intravenous Q12H   Continuous Infusions:  PRN Meds:.sodium chloride, acetaminophen, magnesium hydroxide, morphine injection, nitroGLYCERIN, sodium chloride Assessment/Plan: Atypical Chest pain. Troponin x3 negative, pain resolved on its own, no recurrent chest pain. Likely etiology GERD but could also be secondary to anxiety or coronary vasospasm.  LDL of 88, at goal of <100. -Observation status for MI rule out  -ASA 81 mg daily  -EKG in AM pending -troponin q6h x3 negative -O2 supplementation for with goal saturation >92%   Recent UTI. She was diagnosed with UTI last week with cipro tx, finished course on 09/17/12. UA leuk negative and nitrite negative. Urine culture pending.    HTN. BP elevated mildly elevated to 143/62 this morning.  -Continue Lopressor  DM2. She is on Lantus 5 units qHS, glipizide 10mg , and Novolog 3 units TID ac and SSI. HgbA1C of 7.2, DM2 not at goal (<7) but controlled.  -Lantus 5 units  -SSI sensitive    Hypothyroidism. TSH only mildly elevated to 5.258. Continue home synthroid, daily. She may need mild recheck TSH as outpatient and medication adjustment.    Dementia. Unclear if Alzeheimer's disease or vascular dementia. She is currently at SNF/memory care wing.  -Continue Namenda, Zoloft, and Aricept  -Klonopin 1mg  qHS   History of recurrent bilateral DVT. No recent DVT, pt was on coumadin therapy in the past but now on Xeralto due to dementia.  -Continue Xeralto.   VTE prophylaxis: SCDs, on Xeralto  FEN:  NSL  Replete as needed, goal  K of 4, Mg of 2  Carb mod heart healthy diet  Dispo: Disposition is deferred at this time, awaiting improvement of current medical problems.  Patient to be discharged today.   The patient does have a current PCP (Dr. Cathey Endow), therefore will not be requiring OPC follow-up after discharge.   The patient does not have transportation limitations that hinder transportation to clinic appointments.  Marland Kitchen  Services Needed at time of discharge: Y = Yes, Blank = No PT:   OT:   RN:   Equipment:   Other:     LOS: 1 day   Sarah Yang D 09/18/2012, 1:12 PM

## 2012-10-22 ENCOUNTER — Emergency Department (HOSPITAL_COMMUNITY)
Admission: EM | Admit: 2012-10-22 | Discharge: 2012-10-22 | Disposition: A | Discharge status: Skilled Nursing Facility | Payer: Medicare Other | Attending: Emergency Medicine | Admitting: Emergency Medicine

## 2012-10-22 ENCOUNTER — Emergency Department (HOSPITAL_COMMUNITY): Payer: Medicare Other

## 2012-10-22 ENCOUNTER — Encounter (HOSPITAL_COMMUNITY): Payer: Self-pay | Admitting: Radiology

## 2012-10-22 DIAGNOSIS — E119 Type 2 diabetes mellitus without complications: Secondary | ICD-10-CM | POA: Insufficient documentation

## 2012-10-22 DIAGNOSIS — G309 Alzheimer's disease, unspecified: Secondary | ICD-10-CM | POA: Insufficient documentation

## 2012-10-22 DIAGNOSIS — Z79899 Other long term (current) drug therapy: Secondary | ICD-10-CM | POA: Insufficient documentation

## 2012-10-22 DIAGNOSIS — Z7982 Long term (current) use of aspirin: Secondary | ICD-10-CM | POA: Insufficient documentation

## 2012-10-22 DIAGNOSIS — Z950 Presence of cardiac pacemaker: Secondary | ICD-10-CM | POA: Insufficient documentation

## 2012-10-22 DIAGNOSIS — F0390 Unspecified dementia without behavioral disturbance: Secondary | ICD-10-CM | POA: Insufficient documentation

## 2012-10-22 DIAGNOSIS — W19XXXA Unspecified fall, initial encounter: Secondary | ICD-10-CM

## 2012-10-22 DIAGNOSIS — E039 Hypothyroidism, unspecified: Secondary | ICD-10-CM | POA: Insufficient documentation

## 2012-10-22 DIAGNOSIS — F0280 Dementia in other diseases classified elsewhere without behavioral disturbance: Secondary | ICD-10-CM | POA: Insufficient documentation

## 2012-10-22 DIAGNOSIS — Z794 Long term (current) use of insulin: Secondary | ICD-10-CM | POA: Insufficient documentation

## 2012-10-22 DIAGNOSIS — I10 Essential (primary) hypertension: Secondary | ICD-10-CM | POA: Insufficient documentation

## 2012-10-22 DIAGNOSIS — Z043 Encounter for examination and observation following other accident: Secondary | ICD-10-CM | POA: Insufficient documentation

## 2012-10-22 NOTE — ED Notes (Signed)
AVW:[id number]<XB> Expected date:10/22/12<BR> Expected time: 4:01 PM<BR> Means of arrival:<BR> Comments:<BR> Fall

## 2012-10-22 NOTE — ED Notes (Signed)
[facility] called for transport. Family remains at bedside with pt.

## 2012-10-22 NOTE — ED Notes (Signed)
Per EMS pt fell at nursing home facility.

## 2012-10-22 NOTE — ED Provider Notes (Signed)
History     CSN: [id number]  Arrival date & time 10/22/12  1610   First MD Initiated Contact with Patient 10/22/12 [phone]      Chief Complaint  Patient presents with  . Fall    (Consider location/radiation/quality/duration/timing/severity/associated sxs/prior treatment) HPI The patient denies any complaints. She presents from her nursing facility after an unwitnessed possible fall.  Per report it is unclear if the patient was on the ground according to her caregivers.  No report of significant trauma, disrupted furniture, any pain on the scene according to the patient.  The patient cannot provide any aspects of history of present illness secondary to her dementia.  Level V caveat.  The patient does not know why she is here.  Past Medical History  Diagnosis Date  . Diabetes mellitus without complication   . Hypertension   . Dementia   . Hypothyroidism     Past Surgical History  Procedure Laterality Date  . Pacemaker insertion      No family history on file.  History  Substance Use Topics  . Smoking status: Never Smoker   . Smokeless tobacco: Not on file  . Alcohol Use: No    OB History   Grav Para Term Preterm Abortions TAB SAB Ect Mult Living                  Review of Systems  Unable to perform ROS: Dementia    Allergies  Latex  Home Medications   Current Outpatient Rx  Name  Route  Sig  Dispense  Refill  . acetaminophen (TYLENOL) 500 MG tablet   Oral   Take 500 mg by mouth every 4 (four) hours as needed for pain (Max dose is 300 mg/24 hrs).         Marland Kitchen aspirin EC 81 MG EC tablet   Oral   Take 1 tablet (81 mg total) by mouth daily.         . ciprofloxacin (CIPRO) 250 MG tablet   Oral   Take 250 mg by mouth 2 (two) times daily. For 10 days started 09-08-12         . clonazePAM (KLONOPIN) 1 MG tablet   Oral   Take 1 mg by mouth at bedtime.         . donepezil (ARICEPT) 10 MG tablet   Oral   Take 10 mg by mouth at bedtime.         .  insulin glargine (LANTUS) 100 UNIT/ML injection   Subcutaneous   Inject 5 Units into the skin at bedtime.         Marland Kitchen levothyroxine (SYNTHROID, LEVOTHROID) 100 MCG tablet   Oral   Take 100 mcg by mouth daily.         . magnesium hydroxide (MILK OF MAGNESIA) 400 MG/5ML suspension   Oral   Take 30 mLs by mouth daily as needed for constipation.         . memantine (NAMENDA) 10 MG tablet   Oral   Take 10 mg by mouth 2 (two) times daily.         . metoprolol tartrate (LOPRESSOR) 25 MG tablet   Oral   Take 25 mg by mouth 2 (two) times daily.         . nitroGLYCERIN (NITROSTAT) 0.4 MG SL tablet   Sublingual   Place 1 tablet (0.4 mg total) under the tongue every 5 (five) minutes as needed for chest pain.   20 tablet  0   . pantoprazole (PROTONIX) 40 MG tablet   Oral   Take 1 tablet (40 mg total) by mouth daily at 12 noon.   30 tablet   0   . Rivaroxaban (XARELTO) 20 MG TABS   Oral   Take 20 mg by mouth daily.         . sertraline (ZOLOFT) 25 MG tablet   Oral   Take 25 mg by mouth daily.           BP 167/93  Pulse 91  Temp(Src) 98.9 F (37.2 C) (Oral)  Resp 18  SpO2 96%  Physical Exam  Nursing note and vitals reviewed. Constitutional: She appears well-developed and well-nourished. No distress.  HENT:  Head: Normocephalic.  No gross evidence of trauma.  Eyes: Conjunctivae and EOM are normal.  Neck: Full passive range of motion without pain. Neck supple. No spinous process tenderness and no muscular tenderness present.  Cardiovascular: Normal rate and regular rhythm.   Pulmonary/Chest: Effort normal and breath sounds normal. No stridor. No respiratory distress.  Abdominal: She exhibits no distension.  Musculoskeletal: She exhibits no edema.  Neurological: She is alert. She displays no atrophy and no tremor. No cranial nerve deficit or sensory deficit. She exhibits normal muscle tone. She displays no seizure activity. Coordination normal.  Patient follow  direct commands, moves all extremities spontaneously, but after brief operation doesn't interact appropriate  Skin: Skin is warm and dry.  Psychiatric: She has a normal mood and affect. Her speech is normal. Cognition and memory are impaired. She exhibits abnormal recent memory and abnormal remote memory.    ED Course  Procedures (including critical care time)  Labs Reviewed - No data to display No results found.   1. Fall, initial encounter     A review of the patient's chart demonstrates that she is taking anticoagulants.  CT is indicated given the patient's inability to describe the fall.  I interpreted the CT - sig volume loss  MDM  This elderly female presents after a likely a fall.  On exam she denies complaints, but her dementia presents complete history of present illness details.  The patient does appear in no distress.  Given her use of anticoagulants, she had head CT.  This was negative.  She returned to her nursing home, after ambulating here without distress, antalgic gait.        Gerhard Munch, MD 10/22/12 1726

## 2012-10-24 ENCOUNTER — Emergency Department (HOSPITAL_COMMUNITY): Payer: Medicare Other

## 2012-10-24 ENCOUNTER — Emergency Department (HOSPITAL_COMMUNITY)
Admission: EM | Admit: 2012-10-24 | Discharge: 2012-10-25 | Disposition: A | Discharge status: Home / Self Care | Payer: Medicare Other | Attending: Emergency Medicine | Admitting: Emergency Medicine

## 2012-10-24 ENCOUNTER — Encounter (HOSPITAL_COMMUNITY): Payer: Self-pay

## 2012-10-24 DIAGNOSIS — F0390 Unspecified dementia without behavioral disturbance: Secondary | ICD-10-CM | POA: Insufficient documentation

## 2012-10-24 DIAGNOSIS — M545 Low back pain, unspecified: Secondary | ICD-10-CM | POA: Insufficient documentation

## 2012-10-24 DIAGNOSIS — Z79899 Other long term (current) drug therapy: Secondary | ICD-10-CM | POA: Insufficient documentation

## 2012-10-24 DIAGNOSIS — G309 Alzheimer's disease, unspecified: Secondary | ICD-10-CM | POA: Insufficient documentation

## 2012-10-24 DIAGNOSIS — F0280 Dementia in other diseases classified elsewhere without behavioral disturbance: Secondary | ICD-10-CM | POA: Insufficient documentation

## 2012-10-24 DIAGNOSIS — M549 Dorsalgia, unspecified: Secondary | ICD-10-CM

## 2012-10-24 DIAGNOSIS — E039 Hypothyroidism, unspecified: Secondary | ICD-10-CM | POA: Insufficient documentation

## 2012-10-24 DIAGNOSIS — Z7982 Long term (current) use of aspirin: Secondary | ICD-10-CM | POA: Insufficient documentation

## 2012-10-24 DIAGNOSIS — R509 Fever, unspecified: Secondary | ICD-10-CM | POA: Insufficient documentation

## 2012-10-24 DIAGNOSIS — I10 Essential (primary) hypertension: Secondary | ICD-10-CM | POA: Insufficient documentation

## 2012-10-24 DIAGNOSIS — Z794 Long term (current) use of insulin: Secondary | ICD-10-CM | POA: Insufficient documentation

## 2012-10-24 DIAGNOSIS — E119 Type 2 diabetes mellitus without complications: Secondary | ICD-10-CM | POA: Insufficient documentation

## 2012-10-24 HISTORY — DX: Alzheimer's disease, unspecified: G30.9

## 2012-10-24 HISTORY — DX: Dementia in other diseases classified elsewhere without behavioral disturbance: F02.80

## 2012-10-24 LAB — CBC WITH DIFFERENTIAL/PLATELET
Basophils Absolute: 0 10*3/uL (ref 0.0–0.1)
Basophils Relative: 0 % (ref 0–1)
Eosinophils Absolute: 0.1 10*3/uL (ref 0.0–0.7)
MCH: 28.8 pg (ref 26.0–34.0)
MCHC: 34.8 g/dL (ref 30.0–36.0)
Monocytes Relative: 11 % (ref 3–12)
Neutro Abs: 7.4 10*3/uL (ref 1.7–7.7)
Neutrophils Relative %: 68 % (ref 43–77)
Platelets: 186 10*3/uL (ref 150–400)
RDW: 13.3 % (ref 11.5–15.5)

## 2012-10-24 LAB — COMPREHENSIVE METABOLIC PANEL
AST: 11 U/L (ref 0–37)
Albumin: 2.9 g/dL — ABNORMAL LOW (ref 3.5–5.2)
Alkaline Phosphatase: 63 U/L (ref 39–117)
BUN: 20 mg/dL (ref 6–23)
Chloride: 101 mEq/L (ref 96–112)
Potassium: 3.6 mEq/L (ref 3.5–5.1)
Total Bilirubin: 0.5 mg/dL (ref 0.3–1.2)
Total Protein: 6.3 g/dL (ref 6.0–8.3)

## 2012-10-24 LAB — URINALYSIS, ROUTINE W REFLEX MICROSCOPIC
Bilirubin Urine: NEGATIVE
Hgb urine dipstick: NEGATIVE
Nitrite: NEGATIVE
Protein, ur: NEGATIVE mg/dL
Urobilinogen, UA: 0.2 mg/dL (ref 0.0–1.0)

## 2012-10-24 LAB — URINE MICROSCOPIC-ADD ON

## 2012-10-24 MED ORDER — ACETAMINOPHEN 325 MG PO TABS
650.0000 mg | ORAL_TABLET | Freq: Once | ORAL | Status: AC
  Administered 2012-10-24: 650 mg via ORAL
  Filled 2012-10-24: qty 2

## 2012-10-24 NOTE — ED Notes (Signed)
Per ems- ems called out for back pain. Pt has hx of dementia was very confused, pt at neuro baseline. Pt fell asleep en route. Pt told ems that she had abd pain due to hunger. Pt seemed to have discomfort going from sitting to standing. BP-156/90 Hr-84 RR-14 CBG-265

## 2012-10-24 NOTE — ED Notes (Signed)
[facility] called to transport back to [facility]

## 2012-10-24 NOTE — ED Provider Notes (Signed)
History     CSN: [id number]  Arrival date & time 10/24/12  1842   First MD Initiated Contact with Patient 10/24/12 2003      Chief Complaint  Patient presents with  . Back Pain   level V caveat applies secondary to dementia  HPI  History provided by patient's daughter. Patient is a 78 year old female with history of hypertension, diabetes and dementia who presents with concerns for low back pain. Patient had a fall 2 days ago after standing from a wheelchair. She was seen in emergency room with negative CT scan of the head. Since that time patient has seemed to have some pain in her right side or back area noticed by staff while patient moves and sits up. Patient has also had some decreased appetite recently. There has been no other symptoms or changes in the patient's behavior or mental status.    Past Medical History  Diagnosis Date  . Diabetes mellitus without complication   . Hypertension   . Dementia   . Hypothyroidism   . Alzheimer disease     Past Surgical History  Procedure Laterality Date  . Pacemaker insertion      History reviewed. No pertinent family history.  History  Substance Use Topics  . Smoking status: Never Smoker   . Smokeless tobacco: Not on file  . Alcohol Use: No    OB History   Grav Para Term Preterm Abortions TAB SAB Ect Mult Living                  Review of Systems  Unable to perform ROS Constitutional: Negative for fever.  Respiratory: Negative for cough.   Gastrointestinal: Negative for vomiting and diarrhea.    Allergies  Hydrocodone-homatropine and Latex  Home Medications   Current Outpatient Rx  Name  Route  Sig  Dispense  Refill  . acetaminophen (TYLENOL) 500 MG tablet   Oral   Take 500 mg by mouth every 4 (four) hours as needed for pain (Max dose is 300 mg/24 hrs).         Marland Kitchen aspirin 81 MG EC tablet   Oral   Take 81 mg by mouth every morning.         . clonazePAM (KLONOPIN) 1 MG tablet   Oral   Take 1 mg by  mouth at bedtime.         . donepezil (ARICEPT) 10 MG tablet   Oral   Take 10 mg by mouth at bedtime.         . insulin glargine (LANTUS) 100 UNIT/ML injection   Subcutaneous   Inject 5 Units into the skin at bedtime.         . insulin lispro (HUMALOG) 100 UNIT/ML injection   Subcutaneous   Inject 0-15 Units into the skin 3 (three) times daily before meals. Per sliding scale         . levothyroxine (SYNTHROID, LEVOTHROID) 100 MCG tablet   Oral   Take 100 mcg by mouth every morning.          . magnesium hydroxide (MILK OF MAGNESIA) 400 MG/5ML suspension   Oral   Take 30 mLs by mouth daily as needed for constipation.         . memantine (NAMENDA) 10 MG tablet   Oral   Take 10 mg by mouth 2 (two) times daily.         . metoprolol tartrate (LOPRESSOR) 25 MG tablet   Oral  Take 25 mg by mouth 2 (two) times daily.         . pantoprazole (PROTONIX) 40 MG tablet   Oral   Take 40 mg by mouth every morning.         . pravastatin (PRAVACHOL) 20 MG tablet   Oral   Take 20 mg by mouth at bedtime.         . Rivaroxaban (XARELTO) 20 MG TABS   Oral   Take 20 mg by mouth daily at 6 PM.          . sertraline (ZOLOFT) 25 MG tablet   Oral   Take 25 mg by mouth every morning.            BP 158/73  Pulse 79  Temp(Src) 102.7 F (39.3 C) (Oral)  Resp 22  SpO2 99%  Physical Exam  Nursing note and vitals reviewed. Constitutional: She appears well-developed and well-nourished. No distress.  HENT:  Head: Normocephalic.  Cardiovascular: Normal rate and regular rhythm.   Pulmonary/Chest: Effort normal and breath sounds normal. No respiratory distress. She has no wheezes. She has no rales.  Abdominal: Soft. She exhibits no distension. There is no tenderness. There is no rebound.  Musculoskeletal: Normal range of motion.  Significant kyphosis with some scoliosis of the spine. Patient appears to exhibit tenderness over the right lower chest wall and lower  thoracic and lumbar spine areas. There is no swelling or skin changes.  Neurological: She is alert.  Movement and strength equal in all extremities. No focal neural deficits   Skin: Skin is warm and dry. No rash noted.  Psychiatric: She has a normal mood and affect. Her behavior is normal.    ED Course  Procedures   Results for orders placed during the hospital encounter of 10/24/12  URINALYSIS, ROUTINE W REFLEX MICROSCOPIC      Result Value Range   Color, Urine YELLOW  YELLOW   APPearance CLEAR  CLEAR   Specific Gravity, Urine 1.029  1.005 - 1.030   pH 5.5  5.0 - 8.0   Glucose, UA >1000 (*) NEGATIVE mg/dL   Hgb urine dipstick NEGATIVE  NEGATIVE   Bilirubin Urine NEGATIVE  NEGATIVE   Ketones, ur NEGATIVE  NEGATIVE mg/dL   Protein, ur NEGATIVE  NEGATIVE mg/dL   Urobilinogen, UA 0.2  0.0 - 1.0 mg/dL   Nitrite NEGATIVE  NEGATIVE   Leukocytes, UA NEGATIVE  NEGATIVE  URINE MICROSCOPIC-ADD ON      Result Value Range   Squamous Epithelial / LPF RARE  RARE   WBC, UA 0-2  <3 WBC/hpf   Urine-Other MICROSCOPIC EXAM PERFORMED ON UNCONCENTRATED URINE    CBC WITH DIFFERENTIAL      Result Value Range   WBC 10.8 (*) 4.0 - 10.5 K/uL   RBC 3.65 (*) 3.87 - 5.11 MIL/uL   Hemoglobin 10.5 (*) 12.0 - 15.0 g/dL   HCT 11.9 (*) 14.7 - 82.9 %   MCV 82.7  78.0 - 100.0 fL   MCH 28.8  26.0 - 34.0 pg   MCHC 34.8  30.0 - 36.0 g/dL   RDW 56.2  13.0 - 86.5 %   Platelets 186  150 - 400 K/uL   Neutrophils Relative 68  43 - 77 %   Neutro Abs 7.4  1.7 - 7.7 K/uL   Lymphocytes Relative 20  12 - 46 %   Lymphs Abs 2.1  0.7 - 4.0 K/uL   Monocytes Relative 11  3 - 12 %  Monocytes Absolute 1.2 (*) 0.1 - 1.0 K/uL   Eosinophils Relative 1  0 - 5 %   Eosinophils Absolute 0.1  0.0 - 0.7 K/uL   Basophils Relative 0  0 - 1 %   Basophils Absolute 0.0  0.0 - 0.1 K/uL  COMPREHENSIVE METABOLIC PANEL      Result Value Range   Sodium 134 (*) 135 - 145 mEq/L   Potassium 3.6  3.5 - 5.1 mEq/L   Chloride 101  96 - 112  mEq/L   CO2 21  19 - 32 mEq/L   Glucose, Bld 317 (*) 70 - 99 mg/dL   BUN 20  6 - 23 mg/dL   Creatinine, Ser 1.30 (*) 0.50 - 1.10 mg/dL   Calcium 9.2  8.4 - 86.5 mg/dL   Total Protein 6.3  6.0 - 8.3 g/dL   Albumin 2.9 (*) 3.5 - 5.2 g/dL   AST 11  0 - 37 U/L   ALT 8  0 - 35 U/L   Alkaline Phosphatase 63  39 - 117 U/L   Total Bilirubin 0.5  0.3 - 1.2 mg/dL   GFR calc non Af Amer 43 (*) >90 mL/min   GFR calc Af Amer 49 (*) >90 mL/min       Dg Ribs Unilateral W/chest Right  10/24/2012  *RADIOLOGY REPORT*  Clinical Data: Pain.  RIGHT RIBS AND CHEST - 3+ VIEW  Comparison: Two-view chest x-ray 09/17/2012.  Findings: The heart size is normal.  Emphysematous changes are again noted. The lungs are otherwise clear.  Pacing wires are stable.  Dedicated imaging of the right-sided ribs demonstrates no acute or healing fracture.  Leftward curvature is present in the lower thoracic spine.  IMPRESSION:  1.  Emphysema. 2.  No acute cardiopulmonary disease. 3.  No evidence for acute or healing right-sided rib fractures.   Original Report Authenticated By: Marin Roberts, M.D.    Dg Thoracic Spine 2 View  10/24/2012  *RADIOLOGY REPORT*  Clinical Data: Back pain.  THORACIC SPINE - 2 VIEW  Comparison: Two-view chest x-ray 09/17/2012.  Findings: 12 rib-bearing thoracic type vertebral bodies are present.  Leftward curvature of the thoracic spine is centered at T10-11.  Exaggerated kyphosis is again noted without evidence for acute fracture.  Emphysematous changes are stable.  IMPRESSION:  1.  Exaggerated kyphosis and leftward curvature of the thoracic spine. 2.  Emphysema. 3.  No acute abnormality.   Original Report Authenticated By: Marin Roberts, M.D.    Dg Lumbar Spine Complete  10/24/2012  *RADIOLOGY REPORT*  Clinical Data: Back pain.  LUMBAR SPINE - COMPLETE 4+ VIEW  Comparison: None available.  Findings: Five non-rib bearing lumbar type vertebral bodies are present.  Rightward curvature is evident at  L4-5.  Lower lumbar facet degenerative changes are evident.  The vertebral body heights and alignment otherwise maintained.  The patient is status post bilateral total hip arthroplasty.  IMPRESSION:  1.  No acute abnormality. 2.  Scoliosis. 3.  Degenerative changes of the lower lumbar facets.   Original Report Authenticated By: Marin Roberts, M.D.      1. Back pain   2. Fever       MDM  Patient seen and evaluated. Patient does not appear in any acute distress. She has normal nonfocal neuro exam.  Patient discussed with attending physician. No source for patient's fever found. X-rays show any signs of injury or broken bones to explain possible back pain symptoms. Patient about signs are unremarkable and stable. This time  it is felt she may return to the nursing home and continue treatment for fevers with closely she followup. Daughter agrees with plan.      Angus Seller, PA-C 10/25/12 [phone]

## 2012-10-25 NOTE — ED Notes (Signed)
[facility] contacted to tell them that the pt is coming back.  [facility] here

## 2012-10-25 NOTE — ED Provider Notes (Signed)
Medical screening examination/treatment/procedure(s) were performed by non-physician practitioner and as supervising physician I was immediately available for consultation/collaboration.   Maleia Weems Y. Abiageal Blowe, MD 10/25/12 2327 

## 2012-10-25 NOTE — ED Notes (Signed)
Just prior to leaving the pt took a deep breath and the daughter was scared because she watched the pts pulse ox drop  Fleetingly then went right back up to normal.. Pt  Responded to her name and did not appear to be in any distress.  Offer made to the daughter to watch her for awhile and cancel ptar but she wanted to go back to the nursing home.

## 2012-11-21 ENCOUNTER — Encounter (HOSPITAL_COMMUNITY): Payer: Self-pay | Admitting: *Deleted

## 2012-11-21 ENCOUNTER — Inpatient Hospital Stay (HOSPITAL_COMMUNITY)
Admission: EM | Admit: 2012-11-21 | Discharge: 2012-11-23 | DRG: 689 | Disposition: A | Discharge status: Skilled Nursing Facility | Payer: Medicare Other | Attending: Internal Medicine | Admitting: Internal Medicine

## 2012-11-21 ENCOUNTER — Emergency Department (HOSPITAL_COMMUNITY): Payer: Medicare Other

## 2012-11-21 DIAGNOSIS — E785 Hyperlipidemia, unspecified: Secondary | ICD-10-CM | POA: Diagnosis present

## 2012-11-21 DIAGNOSIS — N390 Urinary tract infection, site not specified: Secondary | ICD-10-CM

## 2012-11-21 DIAGNOSIS — F0390 Unspecified dementia without behavioral disturbance: Secondary | ICD-10-CM | POA: Diagnosis present

## 2012-11-21 DIAGNOSIS — F0280 Dementia in other diseases classified elsewhere without behavioral disturbance: Secondary | ICD-10-CM | POA: Diagnosis present

## 2012-11-21 DIAGNOSIS — D72829 Elevated white blood cell count, unspecified: Secondary | ICD-10-CM | POA: Diagnosis present

## 2012-11-21 DIAGNOSIS — E871 Hypo-osmolality and hyponatremia: Secondary | ICD-10-CM | POA: Diagnosis present

## 2012-11-21 DIAGNOSIS — G309 Alzheimer's disease, unspecified: Secondary | ICD-10-CM | POA: Diagnosis present

## 2012-11-21 DIAGNOSIS — Z95810 Presence of automatic (implantable) cardiac defibrillator: Secondary | ICD-10-CM | POA: Diagnosis present

## 2012-11-21 DIAGNOSIS — I10 Essential (primary) hypertension: Secondary | ICD-10-CM | POA: Diagnosis present

## 2012-11-21 DIAGNOSIS — R079 Chest pain, unspecified: Secondary | ICD-10-CM

## 2012-11-21 DIAGNOSIS — E119 Type 2 diabetes mellitus without complications: Secondary | ICD-10-CM | POA: Diagnosis present

## 2012-11-21 DIAGNOSIS — N179 Acute kidney failure, unspecified: Secondary | ICD-10-CM | POA: Diagnosis present

## 2012-11-21 DIAGNOSIS — G9341 Metabolic encephalopathy: Secondary | ICD-10-CM | POA: Diagnosis present

## 2012-11-21 DIAGNOSIS — E039 Hypothyroidism, unspecified: Secondary | ICD-10-CM | POA: Diagnosis present

## 2012-11-21 DIAGNOSIS — G9340 Encephalopathy, unspecified: Secondary | ICD-10-CM

## 2012-11-21 DIAGNOSIS — Z794 Long term (current) use of insulin: Secondary | ICD-10-CM

## 2012-11-21 DIAGNOSIS — Z950 Presence of cardiac pacemaker: Secondary | ICD-10-CM

## 2012-11-21 LAB — CBC WITH DIFFERENTIAL/PLATELET
Basophils Relative: 1 % (ref 0–1)
Eosinophils Absolute: 0.1 10*3/uL (ref 0.0–0.7)
Eosinophils Relative: 1 % (ref 0–5)
Lymphs Abs: 2.6 10*3/uL (ref 0.7–4.0)
MCH: 26.9 pg (ref 26.0–34.0)
MCHC: 32.9 g/dL (ref 30.0–36.0)
MCV: 81.7 fL (ref 78.0–100.0)
Platelets: 199 10*3/uL (ref 150–400)
RBC: 4.09 MIL/uL (ref 3.87–5.11)
RDW: 13.5 % (ref 11.5–15.5)

## 2012-11-21 LAB — URINALYSIS, ROUTINE W REFLEX MICROSCOPIC
Bilirubin Urine: NEGATIVE
Glucose, UA: NEGATIVE mg/dL
Nitrite: NEGATIVE
Specific Gravity, Urine: 1.023 (ref 1.005–1.030)
pH: 5.5 (ref 5.0–8.0)

## 2012-11-21 LAB — URINE MICROSCOPIC-ADD ON

## 2012-11-21 LAB — GLUCOSE, CAPILLARY: Glucose-Capillary: 190 mg/dL — ABNORMAL HIGH (ref 70–99)

## 2012-11-21 LAB — POCT I-STAT, CHEM 8
BUN: 15 mg/dL (ref 6–23)
Chloride: 102 mEq/L (ref 96–112)
HCT: 30 % — ABNORMAL LOW (ref 36.0–46.0)
Potassium: 3.8 mEq/L (ref 3.5–5.1)

## 2012-11-21 LAB — LACTIC ACID, PLASMA: Lactic Acid, Venous: 2.2 mmol/L (ref 0.5–2.2)

## 2012-11-21 MED ORDER — ACETAMINOPHEN 650 MG RE SUPP
650.0000 mg | Freq: Once | RECTAL | Status: AC
  Administered 2012-11-22: 650 mg via RECTAL
  Filled 2012-11-21: qty 1

## 2012-11-21 MED ORDER — SODIUM CHLORIDE 0.9 % IV SOLN
INTRAVENOUS | Status: DC
  Administered 2012-11-22: via INTRAVENOUS

## 2012-11-21 NOTE — ED Notes (Signed)
Pt transported via [facility] from [facility]. Staff report that pt has been slumped over sleeping all day with tremors and weakness which is not her norm. Hx of AD. Pt denies pain and alert orient x2. Unsteady weak gait upon standing.

## 2012-11-21 NOTE — ED Provider Notes (Signed)
History     CSN: [id number]  Arrival date & time 11/21/12  2039   First MD Initiated Contact with Patient 11/21/12 2304      Chief Complaint  Patient presents with  . Weakness    (Consider location/radiation/quality/duration/timing/severity/associated sxs/prior treatment) Patient is a 78 y.o. female presenting with weakness. The history is provided by a relative. The history is limited by the condition of the patient.  Weakness   patient here with altered mental status times one day according to her daughter. Patient does have a history of advanced dementia and her daughter notes that she has not been as active. No reported fever or vomiting. No cough noted. No recent history of head trauma. No recent medication changes. Patient CBG was above 100. EMS was called and patient transported here. The patient's daughter also notes that patient sustained a left wrist injury after a near fall  Past Medical History  Diagnosis Date  . Diabetes mellitus without complication   . Hypertension   . Dementia   . Hypothyroidism   . Alzheimer disease     Past Surgical History  Procedure Laterality Date  . Pacemaker insertion      History reviewed. No pertinent family history.  History  Substance Use Topics  . Smoking status: Never Smoker   . Smokeless tobacco: Not on file  . Alcohol Use: No    OB History   Grav Para Term Preterm Abortions TAB SAB Ect Mult Living                  Review of Systems  Unable to perform ROS Neurological: Positive for weakness.    Allergies  Hydrocodone-homatropine and Latex  Home Medications   Current Outpatient Rx  Name  Route  Sig  Dispense  Refill  . acetaminophen (TYLENOL) 500 MG tablet   Oral   Take 500 mg by mouth every 4 (four) hours as needed for pain (Max dose is 300 mg/24 hrs).         Marland Kitchen aspirin 81 MG EC tablet   Oral   Take 81 mg by mouth every morning.         . clonazePAM (KLONOPIN) 1 MG tablet   Oral   Take 1 mg by  mouth at bedtime.         . donepezil (ARICEPT) 10 MG tablet   Oral   Take 10 mg by mouth at bedtime.         Marland Kitchen esomeprazole (NEXIUM) 40 MG capsule   Oral   Take 40 mg by mouth daily before breakfast.         . insulin glargine (LANTUS) 100 UNIT/ML injection   Subcutaneous   Inject 8 Units into the skin at bedtime.          . insulin lispro (HUMALOG) 100 UNIT/ML injection   Subcutaneous   Inject 0-15 Units into the skin 3 (three) times daily before meals. Per sliding scale         . levothyroxine (SYNTHROID, LEVOTHROID) 100 MCG tablet   Oral   Take 100 mcg by mouth every morning.          . memantine (NAMENDA) 10 MG tablet   Oral   Take 10 mg by mouth 2 (two) times daily.         . metoprolol tartrate (LOPRESSOR) 25 MG tablet   Oral   Take 25 mg by mouth 2 (two) times daily.         Marland Kitchen  nitroGLYCERIN (NITROSTAT) 0.4 MG SL tablet   Sublingual   Place 0.4 mg under the tongue every 5 (five) minutes as needed for chest pain.         . pravastatin (PRAVACHOL) 20 MG tablet   Oral   Take 20 mg by mouth at bedtime.         . Rivaroxaban (XARELTO) 20 MG TABS   Oral   Take 20 mg by mouth daily at 6 PM.          . sertraline (ZOLOFT) 25 MG tablet   Oral   Take 25 mg by mouth every morning.            BP 135/77  Temp(Src) 100 F (37.8 C) (Oral)  Resp 17  SpO2 100%  Physical Exam  Nursing note and vitals reviewed. Constitutional: She appears well-developed and well-nourished. She appears lethargic.  Non-toxic appearance. No distress.  HENT:  Head: Normocephalic and atraumatic.  Eyes: Conjunctivae, EOM and lids are normal. Pupils are equal, round, and reactive to light.  Neck: Normal range of motion. Neck supple. No tracheal deviation present. No mass present.  Cardiovascular: Normal rate, regular rhythm and normal heart sounds.  Exam reveals no gallop.   No murmur heard. Pulmonary/Chest: Effort normal and breath sounds normal. No stridor. No  respiratory distress. She has no decreased breath sounds. She has no wheezes. She has no rhonchi. She has no rales.  Abdominal: Soft. Normal appearance and bowel sounds are normal. She exhibits no distension. There is no tenderness. There is no rebound and no CVA tenderness.  Musculoskeletal: Normal range of motion. She exhibits no edema and no tenderness.       Arms: Neurological: She appears lethargic. No cranial nerve deficit or sensory deficit.  Patient withdraws to pain in all 4 extremities  Skin: Skin is warm and dry. No abrasion and no rash noted.  Psychiatric: Her affect is blunt. She is slowed.    ED Course  Procedures (including critical care time)  Labs Reviewed  GLUCOSE, CAPILLARY - Abnormal; Notable for the following:    Glucose-Capillary 190 (*)    All other components within normal limits  URINALYSIS, ROUTINE W REFLEX MICROSCOPIC - Abnormal; Notable for the following:    Protein, ur 30 (*)    Leukocytes, UA SMALL (*)    All other components within normal limits  URINE MICROSCOPIC-ADD ON - Abnormal; Notable for the following:    Casts HYALINE CASTS (*)    All other components within normal limits  POCT I-STAT, CHEM 8 - Abnormal; Notable for the following:    Creatinine, Ser 1.20 (*)    Glucose, Bld 199 (*)    Hemoglobin 10.2 (*)    HCT 30.0 (*)    All other components within normal limits  CULTURE, BLOOD (ROUTINE X 2)  CULTURE, BLOOD (ROUTINE X 2)  LACTIC ACID, PLASMA  COMPREHENSIVE METABOLIC PANEL  CBC WITH DIFFERENTIAL   No results found.   No diagnosis found.    MDM  Pt to be treated for uti with rocephin and admited by [facility]        Toy Baker, MD 11/22/12 [phone]

## 2012-11-22 ENCOUNTER — Encounter (HOSPITAL_COMMUNITY): Payer: Self-pay

## 2012-11-22 DIAGNOSIS — N390 Urinary tract infection, site not specified: Principal | ICD-10-CM

## 2012-11-22 DIAGNOSIS — G9340 Encephalopathy, unspecified: Secondary | ICD-10-CM

## 2012-11-22 DIAGNOSIS — F0390 Unspecified dementia without behavioral disturbance: Secondary | ICD-10-CM

## 2012-11-22 DIAGNOSIS — E039 Hypothyroidism, unspecified: Secondary | ICD-10-CM

## 2012-11-22 DIAGNOSIS — E119 Type 2 diabetes mellitus without complications: Secondary | ICD-10-CM

## 2012-11-22 LAB — COMPREHENSIVE METABOLIC PANEL
ALT: 6 U/L (ref 0–35)
ALT: 7 U/L (ref 0–35)
AST: 11 U/L (ref 0–37)
Albumin: 2.6 g/dL — ABNORMAL LOW (ref 3.5–5.2)
Albumin: 2.8 g/dL — ABNORMAL LOW (ref 3.5–5.2)
Alkaline Phosphatase: 56 U/L (ref 39–117)
Alkaline Phosphatase: 61 U/L (ref 39–117)
CO2: 23 mEq/L (ref 19–32)
Calcium: 9.3 mg/dL (ref 8.4–10.5)
Chloride: 100 mEq/L (ref 96–112)
GFR calc Af Amer: 67 mL/min — ABNORMAL LOW (ref >90)
Potassium: 3.7 mEq/L (ref 3.5–5.1)
Potassium: 3.8 mEq/L (ref 3.5–5.1)
Sodium: 138 mEq/L (ref 135–145)
Total Bilirubin: 0.4 mg/dL (ref 0.3–1.2)
Total Protein: 6 g/dL (ref 6.0–8.3)

## 2012-11-22 LAB — TSH: TSH: 8.059 u[IU]/mL — ABNORMAL HIGH (ref 0.350–4.500)

## 2012-11-22 LAB — HEMOGLOBIN A1C
Hgb A1c MFr Bld: 7.6 % — ABNORMAL HIGH (ref <5.7)
Mean Plasma Glucose: 171 mg/dL — ABNORMAL HIGH (ref <117)

## 2012-11-22 LAB — GLUCOSE, CAPILLARY
Glucose-Capillary: 147 mg/dL — ABNORMAL HIGH (ref 70–99)
Glucose-Capillary: 144 mg/dL — ABNORMAL HIGH (ref 70–99)
Glucose-Capillary: 155 mg/dL — ABNORMAL HIGH (ref 70–99)

## 2012-11-22 MED ORDER — SIMVASTATIN 10 MG PO TABS
10.0000 mg | ORAL_TABLET | Freq: Every day | ORAL | Status: DC
  Administered 2012-11-22: 10 mg via ORAL
  Filled 2012-11-22 (×2): qty 1

## 2012-11-22 MED ORDER — MEMANTINE HCL 10 MG PO TABS
10.0000 mg | ORAL_TABLET | Freq: Two times a day (BID) | ORAL | Status: DC
  Administered 2012-11-22 – 2012-11-23 (×3): 10 mg via ORAL
  Filled 2012-11-22 (×4): qty 1

## 2012-11-22 MED ORDER — CLONAZEPAM 1 MG PO TABS
1.0000 mg | ORAL_TABLET | Freq: Every day | ORAL | Status: DC
  Administered 2012-11-22: 1 mg via ORAL
  Filled 2012-11-22: qty 2

## 2012-11-22 MED ORDER — RIVAROXABAN 20 MG PO TABS
20.0000 mg | ORAL_TABLET | Freq: Every day | ORAL | Status: DC
  Administered 2012-11-22: 20 mg via ORAL
  Filled 2012-11-22 (×2): qty 1

## 2012-11-22 MED ORDER — ACETAMINOPHEN 500 MG PO TABS
500.0000 mg | ORAL_TABLET | ORAL | Status: DC | PRN
Start: 2012-11-22 — End: 2012-11-23
  Administered 2012-11-23: 500 mg via ORAL
  Filled 2012-11-22: qty 1

## 2012-11-22 MED ORDER — SODIUM CHLORIDE 0.9 % IJ SOLN
3.0000 mL | Freq: Two times a day (BID) | INTRAMUSCULAR | Status: DC
  Administered 2012-11-22: 3 mL via INTRAVENOUS
  Administered 2012-11-22: 10 mL via INTRAVENOUS

## 2012-11-22 MED ORDER — DEXTROSE 5 % IV SOLN
1.0000 g | INTRAVENOUS | Status: DC
  Administered 2012-11-22 (×2): 1 g via INTRAVENOUS
  Filled 2012-11-22 (×3): qty 10

## 2012-11-22 MED ORDER — INSULIN GLARGINE 100 UNIT/ML ~~LOC~~ SOLN
4.0000 [IU] | Freq: Every day | SUBCUTANEOUS | Status: DC
  Administered 2012-11-23: 4 [IU] via SUBCUTANEOUS
  Filled 2012-11-22 (×2): qty 0.04

## 2012-11-22 MED ORDER — DONEPEZIL HCL 10 MG PO TABS
10.0000 mg | ORAL_TABLET | Freq: Every day | ORAL | Status: DC
  Administered 2012-11-22: 10 mg via ORAL
  Filled 2012-11-22 (×2): qty 1

## 2012-11-22 MED ORDER — INSULIN ASPART 100 UNIT/ML ~~LOC~~ SOLN
0.0000 [IU] | SUBCUTANEOUS | Status: DC
  Administered 2012-11-22 (×2): 1 [IU] via SUBCUTANEOUS
  Administered 2012-11-22: 2 [IU] via SUBCUTANEOUS
  Administered 2012-11-22: 1 [IU] via SUBCUTANEOUS
  Administered 2012-11-22 – 2012-11-23 (×4): 2 [IU] via SUBCUTANEOUS
  Filled 2012-11-22: qty 1

## 2012-11-22 MED ORDER — PANTOPRAZOLE SODIUM 40 MG PO TBEC
80.0000 mg | DELAYED_RELEASE_TABLET | Freq: Every day | ORAL | Status: DC
  Administered 2012-11-22 – 2012-11-23 (×2): 80 mg via ORAL
  Filled 2012-11-22: qty 2
  Filled 2012-11-22: qty 1

## 2012-11-22 MED ORDER — METOPROLOL TARTRATE 25 MG PO TABS
25.0000 mg | ORAL_TABLET | Freq: Two times a day (BID) | ORAL | Status: DC
  Administered 2012-11-22 – 2012-11-23 (×4): 25 mg via ORAL
  Filled 2012-11-22 (×6): qty 1

## 2012-11-22 MED ORDER — ASPIRIN EC 81 MG PO TBEC
81.0000 mg | DELAYED_RELEASE_TABLET | Freq: Every morning | ORAL | Status: DC
  Administered 2012-11-22 – 2012-11-23 (×2): 81 mg via ORAL
  Filled 2012-11-22 (×2): qty 1

## 2012-11-22 MED ORDER — LEVOTHYROXINE SODIUM 100 MCG PO TABS
100.0000 ug | ORAL_TABLET | Freq: Every day | ORAL | Status: DC
  Administered 2012-11-22 – 2012-11-23 (×2): 100 ug via ORAL
  Filled 2012-11-22 (×4): qty 1

## 2012-11-22 MED ORDER — SERTRALINE HCL 25 MG PO TABS
25.0000 mg | ORAL_TABLET | Freq: Every morning | ORAL | Status: DC
  Administered 2012-11-22 – 2012-11-23 (×2): 25 mg via ORAL
  Filled 2012-11-22 (×2): qty 1

## 2012-11-22 MED ORDER — NITROGLYCERIN 0.4 MG SL SUBL: 0.4000 mg | SUBLINGUAL_TABLET | SUBLINGUAL | Status: DC | PRN

## 2012-11-22 MED ORDER — HEPARIN SODIUM (PORCINE) 5000 UNIT/ML IJ SOLN: 5000.0000 [IU] | Freq: Three times a day (TID) | INTRAMUSCULAR | Status: DC

## 2012-11-22 MED ORDER — SODIUM CHLORIDE 0.9 % IV SOLN
INTRAVENOUS | Status: DC
  Administered 2012-11-22: 02:00:00 via INTRAVENOUS

## 2012-11-22 NOTE — Progress Notes (Signed)
[facility] PROGRESS NOTE  Sarah Yang WUJ:[id number] DOB: May 15, 1935 DOA: 11/21/2012 PCP: Pcp Not In System Brief Summary:  This is a 78 y.o. year old female with significant past medical history of dementia, DM, HTN, hypoTSH presenting with AMS x 1 day. She was found to be dehydrated, Hyponatremic and UTI.   Assessment/Plan: 1. Altered mental status; possibly from metabolic encephalopathy from dehydration, hyponatremia, acute renal failure and mild UTI.  - Started her on IV fluds, IV antibiotics for UTI.  - her creatinine improved. - hold off on the sedatives at night.   2. Diabetes mellitus:  CBG (last 3)   Recent Labs  11/21/12 2051 11/22/12 0351 11/22/12 0742  GLUCAP 190* 170* 147*    Resume home dose of lantus and SSI  3. Dementia; resume home medications of namenda and aricept.   4. Recurrent le DVT; on XARELTO.  5. Hypothyroidism: TSH pending and resume home dose of synthroid.   6. DVT prophylaxis.   Code Status: DNR Family Communication: NONE ATBEDSIDE Disposition Plan: pending PT eval.    Consultants: none Antibiotics:  Rocephin 5/5  HPI/Subjective: Comfortable,no new complaints.   Objective: Filed Vitals:   11/22/12 0103 11/22/12 0132 11/22/12 0352 11/22/12 0632  BP: 123/75 123/67 125/62 131/83  Pulse: 89     Temp:  99 F (37.2 C) 98.9 F (37.2 C) 97.3 F (36.3 C)  TempSrc:  Rectal Rectal Axillary  Resp: 25 19 18 16   SpO2: 98% 98% 100% 100%    Intake/Output Summary (Last 24 hours) at 11/22/12 0929 Last data filed at 11/22/12 0201  Gross per 24 hour  Intake      3 ml  Output      0 ml  Net      3 ml   There were no vitals filed for this visit.  Exam: She is alert afebrile. but comfortable.  Heart: S1, S2 normal, no murmur, rub or gallop, regular rate and rhythm  Lungs: clear to auscultation, no wheezes or rales and unlabored breathing  Abdomen: abdomen is soft without significant tenderness, masses, organomegaly or  guarding  Extremities: extremities normal, atraumatic, no cyanosis or edema and L heel ulcer  Neurology: minimally cooperative for exam, no focal deficits noted   Data Reviewed: Basic Metabolic Panel:  Recent Labs Lab 11/21/12 2138 11/21/12 2336 11/22/12 0505  NA 136 134* 138  K 3.8 3.7 3.8  CL 102 100 106  CO2  --  23 23  GLUCOSE 199* 197* 170*  BUN 15 14 13   CREATININE 1.20* 1.06 0.94  CALCIUM  --  9.3 9.3   Liver Function Tests:  Recent Labs Lab 11/21/12 2336 11/22/12 0505  AST 11 11  ALT 6 7  ALKPHOS 61 56  BILITOT 0.4 0.4  PROT 6.5 6.0  ALBUMIN 2.8* 2.6*   No results found for this basename: LIPASE, AMYLASE,  in the last 168 hours No results found for this basename: AMMONIA,  in the last 168 hours CBC:  Recent Labs Lab 11/21/12 2138 11/21/12 2336  WBC  --  11.0*  NEUTROABS  --  7.1  HGB 10.2* 11.0*  HCT 30.0* 33.4*  MCV  --  81.7  PLT  --  199   Cardiac Enzymes: No results found for this basename: CKTOTAL, CKMB, CKMBINDEX, TROPONINI,  in the last 168 hours BNP (last 3 results) No results found for this basename: PROBNP,  in the last 8760 hours CBG:  Recent Labs Lab 11/21/12 2051 11/22/12 0351 11/22/12 9562  GLUCAP 190* 170* 147*    Recent Results (from the past 240 hour(s))  MRSA PCR SCREENING     Status: None   Collection Time    11/22/12  6:40 AM      Result Value Range Status   MRSA by PCR NEGATIVE  NEGATIVE Final   Comment:            The GeneXpert MRSA Assay (FDA     approved for NASAL specimens     only), is one component of a     comprehensive MRSA colonization     surveillance program. It is not     intended to diagnose MRSA     infection nor to guide or     monitor treatment for     MRSA infections.     Studies: Dg Chest 2 View  11/22/2012  *RADIOLOGY REPORT*  Clinical Data: Weakness  CHEST - 2 VIEW  Comparison: Chest radiograph 10/24/2012  Findings: Left-sided pacemaker overlies normal cardiac silhouette. No effusion,  infiltrate, or pneumothorax.  Degenerative changes of the spine and without acute findings are present.  There is bronchitic markings centrally unchanged from prior.  IMPRESSION: No acute cardiopulmonary process.   Original Report Authenticated By: Genevive Bi, M.D.    Dg Wrist Complete Left  11/22/2012  *RADIOLOGY REPORT*  Clinical Data: 78 year old female with wrist bruising and swelling. Does not recall injury.  LEFT WRIST - COMPLETE 3+ VIEW  Comparison: None.  Findings: Osteopenia.  Calcified atherosclerosis in the distal forearm and wrist.  Distal radius and ulna appear intact.  Carpal bone alignment within normal limits.  Joint space loss and subchondral sclerosis along the radial aspect of the carpal bones and thumb metacarpal joint.  Metacarpals intact.  No acute fracture identified.  IMPRESSION: Osteopenia and degenerative changes. No acute fracture or dislocation identified about the left wrist.   Original Report Authenticated By: Erskine Speed, M.D.     Scheduled Meds: . aspirin EC  81 mg Oral q morning - 10a  . cefTRIAXone (ROCEPHIN)  IV  1 g Intravenous Q24H  . clonazePAM  1 mg Oral QHS  . donepezil  10 mg Oral QHS  . insulin aspart  0-9 Units Subcutaneous Q4H  . insulin glargine  4 Units Subcutaneous Daily  . levothyroxine  100 mcg Oral QAC breakfast  . memantine  10 mg Oral BID  . metoprolol tartrate  25 mg Oral BID  . pantoprazole  80 mg Oral Q1200  . Rivaroxaban  20 mg Oral q1800  . sertraline  25 mg Oral q morning - 10a  . simvastatin  10 mg Oral q1800  . sodium chloride  3 mL Intravenous Q12H   Continuous Infusions: . sodium chloride Stopped (11/22/12 0200)  . sodium chloride 75 mL/hr (11/22/12 0440)       Time spent: 30 minutes    Kadyn Chovan  [facility] Pager [phone]. If 7PM-7AM, please contact night-coverage at [email or web address], password [facility] 11/22/2012, 9:29 AM  LOS: 1 day

## 2012-11-22 NOTE — H&P (Signed)
Hospitalist Admission History and Physical  Patient name: Sarah Yang Medical record number: [id number] Date of birth: 12-Sep-1934 Age: 78 y.o. Gender: female  Primary Care Provider: Pcp Not In System  Chief Complaint: AMS History of Present Illness:This is a 78 y.o. year old female with significant past medical history of dementia, DM, HTN, hypoTSH presenting with AMS x 1 day. Per daughter, patient was extremely somnolent throughout the day being and minimally arousable. Daughter states that this is well patient's baseline. Patient is usually very conversive. Daughter states that somnolence persisted throughout the day with no change. Patient sent to the ER for further evaluation from the nursing home given persistent somnolence. Per the daughter, there is been no reported fever, chills, vomiting, diarrhea. Patient is then brought to the ER for further evaluation. In the ER, workup including chest x-ray, left wrist x-ray, blood work essentially negative apart from some concern for UTI with secondary leukocytosis as well as mild hyponatremia and mild anemia. Patient started Rocephin for UTI coverage.   Patient Active Problem List   Diagnosis Date Noted  . Dementia 09/17/2012  . DM2 (diabetes mellitus, type 2) 09/17/2012  . Hypertension 09/17/2012  . Hypothyroidism 09/17/2012  . Chest pain 09/17/2012  . Cardiac pacemaker 09/17/2012   Past Medical History: Past Medical History  Diagnosis Date  . Diabetes mellitus without complication   . Hypertension   . Dementia   . Hypothyroidism   . Alzheimer disease     Past Surgical History: Past Surgical History  Procedure Laterality Date  . Pacemaker insertion      Social History: History   Social History  . Marital Status: Married    Spouse Name: N/A    Number of Children: N/A  . Years of Education: N/A   Social History Main Topics  . Smoking status: Never Smoker   . Smokeless tobacco: None  . Alcohol Use: No  . Drug Use:  No  . Sexually Active: None   Other Topics Concern  . None   Social History Narrative  . None    Family History: History reviewed. No pertinent family history.  Allergies: Allergies  Allergen Reactions  . Hydrocodone-Homatropine     Unknown  . Latex Rash    blisters    Current Facility-Administered Medications  Medication Dose Route Frequency Provider Last Rate Last Dose  . 0.9 %  sodium chloride infusion   Intravenous Continuous Toy Baker, MD 125 mL/hr at 11/22/12 0015    . 0.9 %  sodium chloride infusion   Intravenous Continuous Doree Albee, MD      . acetaminophen (TYLENOL) tablet 500 mg  500 mg Oral Q4H PRN Doree Albee, MD      . aspirin EC tablet 81 mg  81 mg Oral q morning - 10a Doree Albee, MD      . cefTRIAXone (ROCEPHIN) 1 g in dextrose 5 % 50 mL IVPB  1 g Intravenous Q24H Toy Baker, MD 100 mL/hr at 11/22/12 0110 1 g at 11/22/12 0110  . clonazePAM (KLONOPIN) tablet 1 mg  1 mg Oral QHS Doree Albee, MD      . donepezil (ARICEPT) tablet 10 mg  10 mg Oral QHS Doree Albee, MD      . levothyroxine (SYNTHROID, LEVOTHROID) tablet 100 mcg  100 mcg Oral q morning - 10a Doree Albee, MD      . memantine [facility]) tablet 10 mg  10 mg Oral BID Doree Albee, MD      .  metoprolol tartrate (LOPRESSOR) tablet 25 mg  25 mg Oral BID Doree Albee, MD      . nitroGLYCERIN (NITROSTAT) SL tablet 0.4 mg  0.4 mg Sublingual Q5 min PRN Doree Albee, MD      . pantoprazole (PROTONIX) EC tablet 80 mg  80 mg Oral Q1200 Doree Albee, MD      . Rivaroxaban Carlena Hurl) tablet 20 mg  20 mg Oral q1800 Doree Albee, MD      . sertraline (ZOLOFT) tablet 25 mg  25 mg Oral q morning - 10a Doree Albee, MD      . simvastatin (ZOCOR) tablet 10 mg  10 mg Oral q1800 Doree Albee, MD      . sodium chloride 0.9 % injection 3 mL  3 mL Intravenous Q12H Doree Albee, MD       Current Outpatient Prescriptions  Medication Sig Dispense Refill  . acetaminophen (TYLENOL) 500 MG tablet  Take 500 mg by mouth every 4 (four) hours as needed for pain (Max dose is 300 mg/24 hrs).      Marland Kitchen aspirin 81 MG EC tablet Take 81 mg by mouth every morning.      . clonazePAM (KLONOPIN) 1 MG tablet Take 1 mg by mouth at bedtime.      . donepezil (ARICEPT) 10 MG tablet Take 10 mg by mouth at bedtime.      Marland Kitchen esomeprazole (NEXIUM) 40 MG capsule Take 40 mg by mouth daily before breakfast.      . insulin glargine (LANTUS) 100 UNIT/ML injection Inject 8 Units into the skin at bedtime.       . insulin lispro (HUMALOG) 100 UNIT/ML injection Inject 0-15 Units into the skin 3 (three) times daily before meals. Per sliding scale      . levothyroxine (SYNTHROID, LEVOTHROID) 100 MCG tablet Take 100 mcg by mouth every morning.       . memantine (NAMENDA) 10 MG tablet Take 10 mg by mouth 2 (two) times daily.      . metoprolol tartrate (LOPRESSOR) 25 MG tablet Take 25 mg by mouth 2 (two) times daily.      . nitroGLYCERIN (NITROSTAT) 0.4 MG SL tablet Place 0.4 mg under the tongue every 5 (five) minutes as needed for chest pain.      . pravastatin (PRAVACHOL) 20 MG tablet Take 20 mg by mouth at bedtime.      . Rivaroxaban (XARELTO) 20 MG TABS Take 20 mg by mouth daily at 6 PM.       . sertraline (ZOLOFT) 25 MG tablet Take 25 mg by mouth every morning.        Review Of Systems: 12 point ROS negative except as noted above in HPI.  Physical Exam: Filed Vitals:   11/22/12 0132  BP: 123/67  Pulse:   Temp: 99 F (37.2 C)  Resp: 19    General: in bed, somnolent  HEENT: PERRLA and extra ocular movement intact Heart: S1, S2 normal, no murmur, rub or gallop, regular rate and rhythm Lungs: clear to auscultation, no wheezes or rales and unlabored breathing Abdomen: abdomen is soft without significant tenderness, masses, organomegaly or guarding Extremities: extremities normal, atraumatic, no cyanosis or edema and L heel ulcer  Skin:no rashes, no ecchymoses Neurology: minimally cooperative for exam, no focal  deficits noted  Labs and Imaging: Lab Results  Component Value Date/Time   NA 134* 11/21/2012 11:36 PM   K 3.7 11/21/2012 11:36 PM   CL 100 11/21/2012 11:36 PM   CO2 23  11/21/2012 11:36 PM   BUN 14 11/21/2012 11:36 PM   CREATININE 1.06 11/21/2012 11:36 PM   GLUCOSE 197* 11/21/2012 11:36 PM   Lab Results  Component Value Date   WBC 11.0* 11/21/2012   HGB 11.0* 11/21/2012   HCT 33.4* 11/21/2012   MCV 81.7 11/21/2012   PLT 199 11/21/2012   Urinalysis    Component Value Date/Time   COLORURINE YELLOW 11/21/2012 2228   APPEARANCEUR CLEAR 11/21/2012 2228   LABSPEC 1.023 11/21/2012 2228   PHURINE 5.5 11/21/2012 2228   GLUCOSEU NEGATIVE 11/21/2012 2228   HGBUR NEGATIVE 11/21/2012 2228   BILIRUBINUR NEGATIVE 11/21/2012 2228   KETONESUR NEGATIVE 11/21/2012 2228   PROTEINUR 30* 11/21/2012 2228   UROBILINOGEN 0.2 11/21/2012 2228   NITRITE NEGATIVE 11/21/2012 2228   LEUKOCYTESUR SMALL* 11/21/2012 2228       Dg Chest 2 View  11/22/2012  *RADIOLOGY REPORT*  Clinical Data: Weakness  CHEST - 2 VIEW  Comparison: Chest radiograph 10/24/2012  Findings: Left-sided pacemaker overlies normal cardiac silhouette. No effusion, infiltrate, or pneumothorax.  Degenerative changes of the spine and without acute findings are present.  There is bronchitic markings centrally unchanged from prior.  IMPRESSION: No acute cardiopulmonary process.   Original Report Authenticated By: Genevive Bi, M.D.    Dg Wrist Complete Left  11/22/2012  *RADIOLOGY REPORT*  Clinical Data: 78 year old female with wrist bruising and swelling. Does not recall injury.  LEFT WRIST - COMPLETE 3+ VIEW  Comparison: None.  Findings: Osteopenia.  Calcified atherosclerosis in the distal forearm and wrist.  Distal radius and ulna appear intact.  Carpal bone alignment within normal limits.  Joint space loss and subchondral sclerosis along the radial aspect of the carpal bones and thumb metacarpal joint.  Metacarpals intact.  No acute fracture identified.  IMPRESSION: Osteopenia and  degenerative changes. No acute fracture or dislocation identified about the left wrist.   Original Report Authenticated By: Erskine Speed, M.D.      Assessment and Plan: Cionna Collantes is a 78 y.o. year old female presenting with AMS and UTI  AMS:  Suspect likely secondary to infection. Noted leukocytosis. Continue Rocephin. Urine culture. We will continue to follow clinical course closely. Medications, progressive dementia also on DDx albeit lower. Corrected sodium at 136. Continue to follow clinically.  HTN/CAD/Afib: Clinically stable. Continue home regimen. Atrially paced. Continue xarelto. No signs of bleeding.   DM: A1c. SSI.   HypoTSH: Check TSH. Continue synthroid.   Dementia: continue aricept, namenda.   HLD: Continue statin    Psych: Cont SSRI.   FEN/GI: ADAT. PPI Prophylaxis: xarelto Disposition: pending further eval  Code Status:DNR.       Doree Albee MD  Pager: [phone]

## 2012-11-22 NOTE — Progress Notes (Signed)
Clinical Social Work Department BRIEF PSYCHOSOCIAL ASSESSMENT 11/22/2012  Patient:  Sarah Yang, Sarah Yang     Account Number:  [id number]     Admit date:  11/21/2012  Clinical Social Worker:  Dennison Bulla  Date/Time:  11/22/2012 11:50 AM  Referred by:  Physician  Date Referred:  11/22/2012 Referred for  ALF Placement   Other Referral:   Interview type:  Patient Other interview type:    PSYCHOSOCIAL DATA Living Status:  FACILITY Admitted from facility:  [facility] Level of care:  Assisted Living Primary support name:  Rosey Bath Primary support relationship to patient:  CHILD, ADULT Degree of support available:   Strong    CURRENT CONCERNS Current Concerns  Post-Acute Placement   Other Concerns:    SOCIAL WORK ASSESSMENT / PLAN CSW received referral due to patient being admitted from a facility. CSW reviewed chart and attempted to meet with patient at bedside. Patient sleeping and was too drowsy to complete assessment. CSW spoke with dtr Rosey Bath) via phone.    CSW introduced myself and explained role. Dtr reports that patient has been at [facility] (ALF) since January 2014. Dtr reports that patient is doing well there and if at all possible would like for her to return. Dtr works at facility and reports that facility is willing to accept patient back at DC.    CSW agreeable to continue to follow and explained if patient needed a higher level of care then CSW could assist with placement. CSW provided dtr with CSW contact information if further needs arise.    CSW completed FL2 and will update prior to DC.   Assessment/plan status:  Psychosocial Support/Ongoing Assessment of Needs Other assessment/ plan:   Information/referral to community resources:   Will return to ALF    PATIENT'S/FAMILY'S RESPONSE TO PLAN OF CARE: Patient unable to complete assessment at this time. Dtr engaged throughout assessment and appreciative of CSW call. Dtr agreeable to  CSW follow up throughout hospitalization.

## 2012-11-23 DIAGNOSIS — Z950 Presence of cardiac pacemaker: Secondary | ICD-10-CM

## 2012-11-23 DIAGNOSIS — I10 Essential (primary) hypertension: Secondary | ICD-10-CM

## 2012-11-23 DIAGNOSIS — N390 Urinary tract infection, site not specified: Secondary | ICD-10-CM

## 2012-11-23 DIAGNOSIS — G9340 Encephalopathy, unspecified: Secondary | ICD-10-CM

## 2012-11-23 LAB — CBC WITH DIFFERENTIAL/PLATELET
Eosinophils Relative: 2 % (ref 0–5)
Hemoglobin: 9.8 g/dL — ABNORMAL LOW (ref 12.0–15.0)
Lymphocytes Relative: 25 % (ref 12–46)
Lymphs Abs: 2.4 10*3/uL (ref 0.7–4.0)
MCH: 26.9 pg (ref 26.0–34.0)
MCV: 81.3 fL (ref 78.0–100.0)
Monocytes Relative: 7 % (ref 3–12)
Neutrophils Relative %: 66 % (ref 43–77)
Platelets: 214 10*3/uL (ref 150–400)
RBC: 3.64 MIL/uL — ABNORMAL LOW (ref 3.87–5.11)
WBC: 9.4 10*3/uL (ref 4.0–10.5)

## 2012-11-23 LAB — URINE CULTURE: Colony Count: NO GROWTH

## 2012-11-23 MED ORDER — INSULIN GLARGINE 100 UNIT/ML ~~LOC~~ SOLN: 4.0000 [IU] | Freq: Every day | SUBCUTANEOUS | Status: DC

## 2012-11-23 MED ORDER — CIPROFLOXACIN HCL 250 MG PO TABS: 250.0000 mg | ORAL_TABLET | Freq: Two times a day (BID) | ORAL | Status: DC

## 2012-11-23 NOTE — Progress Notes (Signed)
Clinical Social Work  CSW faxed DC summary and FL2 to [facility] and ALF is agreeable to accept patient back today. CSW informed RN, patient and dtr of DC plans and all parties agreeable. CSW prepared DC packet with FL2 and DNR form included. CSW coordinated transportation via [place]. (Service Request [phone]). CSW is signing off but available if needed.  [place], [place] [phone]

## 2012-11-23 NOTE — Progress Notes (Signed)
Patient discharge to SNF, alert and oriented, discharge package given to Medical Transportation for delivery to SNF, patient not able to understand My Chart access at this time, patient in stable condition at this time

## 2012-11-23 NOTE — Discharge Summary (Signed)
Physician Discharge Summary  Patient ID: Sarah Yang MRN: [id number] DOB/AGE: 09/11/1934 78 y.o.  Admit date: 11/21/2012 Discharge date: 11/23/2012  Primary Care Physician:  Pcp Not In System   Discharge Diagnoses:    Principal Problem:   Encephalopathy acute Active Problems:   Dementia   DM2 (diabetes mellitus, type 2)   Hypertension   Hypothyroidism   UTI (urinary tract infection)      Medication List    STOP taking these medications       clonazePAM 1 MG tablet  Commonly known as:  KLONOPIN      TAKE these medications       acetaminophen 500 MG tablet  Commonly known as:  TYLENOL  Take 500 mg by mouth every 4 (four) hours as needed for pain (Max dose is 300 mg/24 hrs).     aspirin 81 MG EC tablet  Take 81 mg by mouth every morning.     ciprofloxacin 250 MG tablet  Commonly known as:  CIPRO  Take 1 tablet (250 mg total) by mouth 2 (two) times daily. For 5 days     donepezil 10 MG tablet  Commonly known as:  ARICEPT  Take 10 mg by mouth at bedtime.     esomeprazole 40 MG capsule  Commonly known as:  NEXIUM  Take 40 mg by mouth daily before breakfast.     insulin glargine 100 UNIT/ML injection  Commonly known as:  LANTUS  Inject 0.04 mLs (4 Units total) into the skin daily.     insulin lispro 100 UNIT/ML injection  Commonly known as:  HUMALOG  Inject 0-15 Units into the skin 3 (three) times daily before meals. Per sliding scale     levothyroxine 100 MCG tablet  Commonly known as:  SYNTHROID, LEVOTHROID  Take 100 mcg by mouth every morning.     memantine 10 MG tablet  Commonly known as:  NAMENDA  Take 10 mg by mouth 2 (two) times daily.     metoprolol tartrate 25 MG tablet  Commonly known as:  LOPRESSOR  Take 25 mg by mouth 2 (two) times daily.     nitroGLYCERIN 0.4 MG SL tablet  Commonly known as:  NITROSTAT  Place 0.4 mg under the tongue every 5 (five) minutes as needed for chest pain.     pravastatin 20 MG tablet  Commonly known as:   PRAVACHOL  Take 20 mg by mouth at bedtime.     sertraline 25 MG tablet  Commonly known as:  ZOLOFT  Take 25 mg by mouth every morning.     XARELTO 20 MG Tabs  Generic drug:  Rivaroxaban  Take 20 mg by mouth daily at 6 PM.         Disposition and Follow-up:  Will be discharged to SNF today in stable and improved condition. Please follow results of urine cx to make sure that cipro is an appropriate antibiotic choice.  Consults:  None    Significant Diagnostic Studies:  Dg Chest 2 View  11/22/2012  *RADIOLOGY REPORT*  Clinical Data: Weakness  CHEST - 2 VIEW  Comparison: Chest radiograph 10/24/2012  Findings: Left-sided pacemaker overlies normal cardiac silhouette. No effusion, infiltrate, or pneumothorax.  Degenerative changes of the spine and without acute findings are present.  There is bronchitic markings centrally unchanged from prior.  IMPRESSION: No acute cardiopulmonary process.   Original Report Authenticated By: Genevive Bi, M.D.    Dg Wrist Complete Left  11/22/2012  *RADIOLOGY REPORT*  Clinical Data: 78 year old  female with wrist bruising and swelling. Does not recall injury.  LEFT WRIST - COMPLETE 3+ VIEW  Comparison: None.  Findings: Osteopenia.  Calcified atherosclerosis in the distal forearm and wrist.  Distal radius and ulna appear intact.  Carpal bone alignment within normal limits.  Joint space loss and subchondral sclerosis along the radial aspect of the carpal bones and thumb metacarpal joint.  Metacarpals intact.  No acute fracture identified.  IMPRESSION: Osteopenia and degenerative changes. No acute fracture or dislocation identified about the left wrist.   Original Report Authenticated By: Erskine Speed, M.D.     Brief H and P: For complete details please refer to admission H and P, but in brief patient is a 78 y.o. year old female with significant past medical history of dementia, DM, HTN, hypoTSH presenting with AMS x 1 day. Per daughter, patient was extremely  somnolent throughout the day being and minimally arousable. Daughter states that this is well patient's baseline. Patient is usually very conversive. Daughter states that somnolence persisted throughout the day with no change. Patient sent to the ER for further evaluation from the nursing home given persistent somnolence. Per the daughter, there is been no reported fever, chills, vomiting, diarrhea.  Patient is then brought to the ER for further evaluation. In the ER, workup including chest x-ray, left wrist x-ray, blood work essentially negative apart from some concern for UTI with secondary leukocytosis as well as mild hyponatremia and mild anemia. We were asked to admit her for further evaluation and management.     Hospital Course:  Principal Problem:   Encephalopathy acute Active Problems:   Dementia   DM2 (diabetes mellitus, type 2)   Hypertension   Hypothyroidism   UTI (urinary tract infection)   Acute Encephalopathy -Resolved. -Back to baseline. -Presumably from UTI/milf dehydration.  UTI -Cx data pending. -Received 2 doses of Rocephin. -Will transition to cipro at time of DC. -Cx data will need to be followed and antibiotics adjusted if necessary.  Rest of chronic medical conditions have been stable this hospitalization.  Time spent on Discharge: Greater than 30 minutes.  SignedChaya Jan [facility] Pager: [phone] 11/23/2012, 10:51 AM

## 2012-11-24 LAB — GLUCOSE, CAPILLARY: Glucose-Capillary: 195 mg/dL — ABNORMAL HIGH (ref 70–99)

## 2012-11-25 ENCOUNTER — Inpatient Hospital Stay (HOSPITAL_COMMUNITY): Payer: Medicare Other

## 2012-11-25 ENCOUNTER — Encounter (HOSPITAL_COMMUNITY): Payer: Self-pay | Admitting: Emergency Medicine

## 2012-11-25 ENCOUNTER — Inpatient Hospital Stay (HOSPITAL_COMMUNITY)
Admission: EM | Admit: 2012-11-25 | Discharge: 2012-11-29 | DRG: 871 | Payer: Medicare Other | Attending: Internal Medicine | Admitting: Internal Medicine

## 2012-11-25 ENCOUNTER — Emergency Department (HOSPITAL_COMMUNITY): Payer: Medicare Other

## 2012-11-25 ENCOUNTER — Other Ambulatory Visit: Payer: Self-pay

## 2012-11-25 DIAGNOSIS — E039 Hypothyroidism, unspecified: Secondary | ICD-10-CM | POA: Diagnosis present

## 2012-11-25 DIAGNOSIS — Z950 Presence of cardiac pacemaker: Secondary | ICD-10-CM

## 2012-11-25 DIAGNOSIS — Z7982 Long term (current) use of aspirin: Secondary | ICD-10-CM

## 2012-11-25 DIAGNOSIS — I10 Essential (primary) hypertension: Secondary | ICD-10-CM | POA: Diagnosis present

## 2012-11-25 DIAGNOSIS — Z79899 Other long term (current) drug therapy: Secondary | ICD-10-CM

## 2012-11-25 DIAGNOSIS — M1710 Unilateral primary osteoarthritis, unspecified knee: Secondary | ICD-10-CM | POA: Diagnosis present

## 2012-11-25 DIAGNOSIS — L02519 Cutaneous abscess of unspecified hand: Secondary | ICD-10-CM | POA: Diagnosis present

## 2012-11-25 DIAGNOSIS — IMO0002 Reserved for concepts with insufficient information to code with codable children: Secondary | ICD-10-CM | POA: Diagnosis present

## 2012-11-25 DIAGNOSIS — L03119 Cellulitis of unspecified part of limb: Secondary | ICD-10-CM | POA: Diagnosis present

## 2012-11-25 DIAGNOSIS — E119 Type 2 diabetes mellitus without complications: Secondary | ICD-10-CM | POA: Diagnosis present

## 2012-11-25 DIAGNOSIS — L989 Disorder of the skin and subcutaneous tissue, unspecified: Secondary | ICD-10-CM | POA: Diagnosis present

## 2012-11-25 DIAGNOSIS — F0280 Dementia in other diseases classified elsewhere without behavioral disturbance: Secondary | ICD-10-CM | POA: Diagnosis present

## 2012-11-25 DIAGNOSIS — G309 Alzheimer's disease, unspecified: Secondary | ICD-10-CM | POA: Diagnosis present

## 2012-11-25 DIAGNOSIS — I4891 Unspecified atrial fibrillation: Secondary | ICD-10-CM | POA: Diagnosis present

## 2012-11-25 DIAGNOSIS — Z905 Acquired absence of kidney: Secondary | ICD-10-CM

## 2012-11-25 DIAGNOSIS — E876 Hypokalemia: Secondary | ICD-10-CM | POA: Diagnosis present

## 2012-11-25 DIAGNOSIS — F0390 Unspecified dementia without behavioral disturbance: Secondary | ICD-10-CM

## 2012-11-25 DIAGNOSIS — A419 Sepsis, unspecified organism: Principal | ICD-10-CM | POA: Diagnosis present

## 2012-11-25 DIAGNOSIS — Z794 Long term (current) use of insulin: Secondary | ICD-10-CM

## 2012-11-25 DIAGNOSIS — L0390 Cellulitis, unspecified: Secondary | ICD-10-CM

## 2012-11-25 DIAGNOSIS — G9340 Encephalopathy, unspecified: Secondary | ICD-10-CM | POA: Diagnosis present

## 2012-11-25 LAB — URINALYSIS, ROUTINE W REFLEX MICROSCOPIC
Glucose, UA: 250 mg/dL — AB
Hgb urine dipstick: NEGATIVE
Specific Gravity, Urine: 1.019 (ref 1.005–1.030)
Urobilinogen, UA: 0.2 mg/dL (ref 0.0–1.0)
pH: 6 (ref 5.0–8.0)

## 2012-11-25 LAB — CBC WITH DIFFERENTIAL/PLATELET
Basophils Absolute: 0 10*3/uL (ref 0.0–0.1)
Basophils Relative: 0 % (ref 0–1)
Eosinophils Absolute: 0 10*3/uL (ref 0.0–0.7)
Hemoglobin: 9.9 g/dL — ABNORMAL LOW (ref 12.0–15.0)
MCH: 27.2 pg (ref 26.0–34.0)
MCHC: 33.8 g/dL (ref 30.0–36.0)
Monocytes Absolute: 0.9 10*3/uL (ref 0.1–1.0)
Monocytes Relative: 7 % (ref 3–12)
Neutro Abs: 10.6 10*3/uL — ABNORMAL HIGH (ref 1.7–7.7)
Neutrophils Relative %: 85 % — ABNORMAL HIGH (ref 43–77)
RDW: 13.4 % (ref 11.5–15.5)

## 2012-11-25 LAB — COMPREHENSIVE METABOLIC PANEL
AST: 12 U/L (ref 0–37)
Albumin: 2.9 g/dL — ABNORMAL LOW (ref 3.5–5.2)
BUN: 18 mg/dL (ref 6–23)
Creatinine, Ser: 1.19 mg/dL — ABNORMAL HIGH (ref 0.50–1.10)
Potassium: 4.1 mEq/L (ref 3.5–5.1)
Total Protein: 6.3 g/dL (ref 6.0–8.3)

## 2012-11-25 LAB — POCT I-STAT, CHEM 8
Calcium, Ion: 1.16 mmol/L (ref 1.13–1.30)
Chloride: 104 mEq/L (ref 96–112)
HCT: 28 % — ABNORMAL LOW (ref 36.0–46.0)
Sodium: 136 mEq/L (ref 135–145)

## 2012-11-25 LAB — URINE MICROSCOPIC-ADD ON

## 2012-11-25 LAB — GLUCOSE, CAPILLARY: Glucose-Capillary: 158 mg/dL — ABNORMAL HIGH (ref 70–99)

## 2012-11-25 LAB — CG4 I-STAT (LACTIC ACID)
Lactic Acid, Venous: 4.22 mmol/L — ABNORMAL HIGH (ref 0.5–2.2)
Lactic Acid, Venous: 2.45 mmol/L — ABNORMAL HIGH (ref 0.5–2.2)

## 2012-11-25 MED ORDER — HEPARIN (PORCINE) IN NACL 100-0.45 UNIT/ML-% IJ SOLN
1100.0000 [IU]/h | INTRAMUSCULAR | Status: DC
  Administered 2012-11-25: 750 [IU]/h via INTRAVENOUS
  Administered 2012-11-27: 1100 [IU]/h via INTRAVENOUS
  Administered 2012-11-27: 950 [IU]/h via INTRAVENOUS
  Filled 2012-11-25 (×3): qty 250

## 2012-11-25 MED ORDER — GUAIFENESIN 100 MG/5ML PO LIQD
200.0000 mg | Freq: Four times a day (QID) | ORAL | Status: DC | PRN
Start: 2012-11-25 — End: 2012-11-29
  Filled 2012-11-25: qty 10

## 2012-11-25 MED ORDER — PIPERACILLIN-TAZOBACTAM 3.375 G IVPB
3.3750 g | Freq: Three times a day (TID) | INTRAVENOUS | Status: DC
  Administered 2012-11-25 – 2012-11-29 (×12): 3.375 g via INTRAVENOUS
  Filled 2012-11-25 (×18): qty 50

## 2012-11-25 MED ORDER — SIMVASTATIN 10 MG PO TABS
10.0000 mg | ORAL_TABLET | Freq: Every day | ORAL | Status: DC
  Administered 2012-11-26 – 2012-11-29 (×4): 10 mg via ORAL
  Filled 2012-11-25 (×4): qty 1

## 2012-11-25 MED ORDER — INSULIN GLARGINE 100 UNIT/ML ~~LOC~~ SOLN
8.0000 [IU] | Freq: Every day | SUBCUTANEOUS | Status: DC
  Administered 2012-11-25 – 2012-11-28 (×4): 8 [IU] via SUBCUTANEOUS
  Filled 2012-11-25 (×6): qty 0.08

## 2012-11-25 MED ORDER — SODIUM CHLORIDE 0.9 % IV SOLN
Freq: Once | INTRAVENOUS | Status: AC
  Administered 2012-11-25: 16:00:00 via INTRAVENOUS

## 2012-11-25 MED ORDER — PIPERACILLIN-TAZOBACTAM 3.375 G IVPB 30 MIN
3.3750 g | Freq: Once | INTRAVENOUS | Status: AC
  Administered 2012-11-25: 3.375 g via INTRAVENOUS
  Filled 2012-11-25: qty 50

## 2012-11-25 MED ORDER — PANTOPRAZOLE SODIUM 40 MG PO TBEC
40.0000 mg | DELAYED_RELEASE_TABLET | Freq: Every day | ORAL | Status: DC
  Administered 2012-11-26 – 2012-11-29 (×4): 40 mg via ORAL
  Filled 2012-11-25 (×4): qty 1

## 2012-11-25 MED ORDER — ONDANSETRON HCL 4 MG PO TABS: 4.0000 mg | ORAL_TABLET | Freq: Four times a day (QID) | ORAL | Status: DC | PRN

## 2012-11-25 MED ORDER — SODIUM CHLORIDE 0.9 % IV SOLN
Freq: Once | INTRAVENOUS | Status: AC
  Administered 2012-11-25: 20:00:00 via INTRAVENOUS

## 2012-11-25 MED ORDER — INSULIN ASPART 100 UNIT/ML ~~LOC~~ SOLN
0.0000 [IU] | Freq: Three times a day (TID) | SUBCUTANEOUS | Status: DC
  Administered 2012-11-26 (×2): 1 [IU] via SUBCUTANEOUS
  Administered 2012-11-26: 2 [IU] via SUBCUTANEOUS
  Administered 2012-11-27 (×3): 1 [IU] via SUBCUTANEOUS
  Administered 2012-11-28 (×3): 2 [IU] via SUBCUTANEOUS
  Administered 2012-11-29: 3 [IU] via SUBCUTANEOUS
  Administered 2012-11-29 (×2): 1 [IU] via SUBCUTANEOUS

## 2012-11-25 MED ORDER — METOPROLOL TARTRATE 25 MG PO TABS
25.0000 mg | ORAL_TABLET | Freq: Two times a day (BID) | ORAL | Status: DC
  Administered 2012-11-25 – 2012-11-29 (×8): 25 mg via ORAL
  Filled 2012-11-25 (×9): qty 1

## 2012-11-25 MED ORDER — NITROGLYCERIN 0.4 MG SL SUBL: 0.4000 mg | SUBLINGUAL_TABLET | SUBLINGUAL | Status: DC | PRN

## 2012-11-25 MED ORDER — VANCOMYCIN HCL IN DEXTROSE 1-5 GM/200ML-% IV SOLN
1000.0000 mg | Freq: Once | INTRAVENOUS | Status: AC
  Administered 2012-11-25: 1000 mg via INTRAVENOUS
  Filled 2012-11-25: qty 200

## 2012-11-25 MED ORDER — ONDANSETRON HCL 4 MG/2ML IJ SOLN
4.0000 mg | Freq: Four times a day (QID) | INTRAMUSCULAR | Status: DC | PRN
  Administered 2012-11-26: 4 mg via INTRAVENOUS
  Filled 2012-11-25: qty 2

## 2012-11-25 MED ORDER — VANCOMYCIN HCL IN DEXTROSE 750-5 MG/150ML-% IV SOLN
750.0000 mg | INTRAVENOUS | Status: DC
  Administered 2012-11-26: 750 mg via INTRAVENOUS
  Filled 2012-11-25 (×2): qty 150

## 2012-11-25 MED ORDER — ASPIRIN 81 MG PO TBEC
81.0000 mg | DELAYED_RELEASE_TABLET | Freq: Every morning | ORAL | Status: DC
  Administered 2012-11-26: 81 mg via ORAL
  Filled 2012-11-25 (×2): qty 1

## 2012-11-25 MED ORDER — LEVOTHYROXINE SODIUM 100 MCG PO TABS
100.0000 ug | ORAL_TABLET | Freq: Every morning | ORAL | Status: DC
  Administered 2012-11-26 – 2012-11-29 (×4): 100 ug via ORAL
  Filled 2012-11-25 (×4): qty 1

## 2012-11-25 MED ORDER — DONEPEZIL HCL 10 MG PO TABS
10.0000 mg | ORAL_TABLET | Freq: Every day | ORAL | Status: DC
  Administered 2012-11-25 – 2012-11-28 (×4): 10 mg via ORAL
  Filled 2012-11-25 (×5): qty 1

## 2012-11-25 MED ORDER — MAGNESIUM HYDROXIDE 400 MG/5ML PO SUSP: 30.0000 mL | Freq: Every evening | ORAL | Status: DC | PRN

## 2012-11-25 MED ORDER — ACETAMINOPHEN 325 MG PO TABS
650.0000 mg | ORAL_TABLET | Freq: Four times a day (QID) | ORAL | Status: DC | PRN
  Administered 2012-11-25 – 2012-11-27 (×6): 650 mg via ORAL
  Filled 2012-11-25 (×6): qty 2

## 2012-11-25 MED ORDER — SERTRALINE HCL 25 MG PO TABS
25.0000 mg | ORAL_TABLET | Freq: Every morning | ORAL | Status: DC
  Administered 2012-11-26 – 2012-11-29 (×4): 25 mg via ORAL
  Filled 2012-11-25 (×4): qty 1

## 2012-11-25 MED ORDER — ACETAMINOPHEN 650 MG RE SUPP: 650.0000 mg | Freq: Four times a day (QID) | RECTAL | Status: DC | PRN

## 2012-11-25 MED ORDER — SODIUM CHLORIDE 0.9 % IV SOLN
INTRAVENOUS | Status: AC
  Administered 2012-11-25 – 2012-11-26 (×2): via INTRAVENOUS

## 2012-11-25 MED ORDER — MEMANTINE HCL 10 MG PO TABS
10.0000 mg | ORAL_TABLET | Freq: Two times a day (BID) | ORAL | Status: DC
  Administered 2012-11-25 – 2012-11-29 (×8): 10 mg via ORAL
  Filled 2012-11-25 (×9): qty 1

## 2012-11-25 MED ORDER — SODIUM CHLORIDE 0.9 % IJ SOLN
3.0000 mL | Freq: Two times a day (BID) | INTRAMUSCULAR | Status: DC
  Administered 2012-11-25 – 2012-11-27 (×4): 3 mL via INTRAVENOUS

## 2012-11-25 NOTE — Progress Notes (Signed)
ANTIBIOTIC CONSULT NOTE - INITIAL  Pharmacy Consult for vancomycin/zosyn Indication: sepsis/possible urosepsis  Allergies  Allergen Reactions  . Hydrocodone-Homatropine     Unknown  . Latex Rash    blisters    Patient Measurements: Height: 5' 1.02" (155 cm) Weight: 123 lb 14.4 oz (56.2 kg) IBW/kg (Calculated) : 47.86  Vital Signs: Temp: 102.7 F (39.3 C) (05/09 1528) Temp src: Rectal (05/09 1528) BP: 107/58 mmHg (05/09 1528) Pulse Rate: 84 (05/09 1528) Intake/Output from previous day:   Intake/Output from this shift:    Labs:  Recent Labs  11/23/12 0621  WBC Sarah.4  HGB Sarah.8*  PLT 214   Estimated Creatinine Clearance: 39.1 ml/min (by C-G formula based on Cr of 0.94). No results found for this basename: VANCOTROUGH, Leodis Binet, VANCORANDOM, GENTTROUGH, GENTPEAK, GENTRANDOM, TOBRATROUGH, TOBRAPEAK, TOBRARND, AMIKACINPEAK, AMIKACINTROU, AMIKACIN,  in the last 72 hours   Microbiology: Recent Results (from the past 720 hour(s))  URINE CULTURE     Status: None   Collection Time    11/21/12 10:28 PM      Result Value Range Status   Specimen Description URINE, CATHETERIZED   Final   Special Requests NONE   Final   Culture  Setup Time 11/22/2012 18:48   Final   Colony Count NO GROWTH   Final   Culture NO GROWTH   Final   Report Status 11/23/2012 FINAL   Final  CULTURE, BLOOD (ROUTINE X 2)     Status: None   Collection Time    11/21/12 11:36 PM      Result Value Range Status   Specimen Description BLOOD LEFT WRIST   Final   Special Requests BOTTLES DRAWN AEROBIC AND ANAEROBIC   Final   Culture  Setup Time 11/22/2012 03:49   Final   Culture     Final   Value:        BLOOD CULTURE RECEIVED NO GROWTH TO DATE CULTURE WILL BE HELD FOR 5 DAYS BEFORE ISSUING A FINAL NEGATIVE REPORT   Report Status PENDING   Incomplete  CULTURE, BLOOD (ROUTINE X 2)     Status: None   Collection Time    11/21/12 11:43 PM      Result Value Range Status   Specimen Description BLOOD RIGHT  ARM   Final   Special Requests BOTTLES DRAWN AEROBIC AND ANAEROBIC 5CC   Final   Culture  Setup Time 11/22/2012 03:49   Final   Culture     Final   Value:        BLOOD CULTURE RECEIVED NO GROWTH TO DATE CULTURE WILL BE HELD FOR 5 DAYS BEFORE ISSUING A FINAL NEGATIVE REPORT   Report Status PENDING   Incomplete  MRSA PCR SCREENING     Status: None   Collection Time    11/22/12  6:40 AM      Result Value Range Status   MRSA by PCR NEGATIVE  NEGATIVE Final   Comment:            The GeneXpert MRSA Assay (FDA     approved for NASAL specimens     only), is one component of a     comprehensive MRSA colonization     surveillance program. It is not     intended to diagnose MRSA     infection nor to guide or     monitor treatment for     MRSA infections.    Medical History: Past Medical History  Diagnosis Date  . Diabetes mellitus  without complication   . Hypertension   . Dementia   . Hypothyroidism   . Alzheimer disease     Medications:  Pending  Assessment: 78 year old Yang admitted with weakness and possible sepsis/urosepsis. Code Sepsis was initiated. Pharmacy consulted EDP and will start broad empiric abx with vancomycin and zosyn. Note that patient was recently discharged from [facility] (11/23/12).   Fever 102.7 in ED, bp low 100/50s, wbc normal at Sarah.4. Scr was normal on 5/6 will follow up current value and make adjustments as needed to abx dosing. No growth from blood and urine culture from previous admit.  Goal of Therapy:  Vancomycin trough level 15-20 mcg/ml  Plan:  Measure antibiotic drug levels at steady state Follow up culture results Zosyn 3.375g IV x1 now over 30 min then 3.375g iv over 6 hours Vancomycin 1g IV x 1 now then 750mg  IV q24 hours  Sheppard Coil PharmD., BCPS Clinical Pharmacist Pager [phone] 5/Sarah/2014 4:06 PM

## 2012-11-25 NOTE — ED Notes (Signed)
MD at bedside. Toniann Fail MD

## 2012-11-25 NOTE — H&P (Signed)
[facility] History and Physical  [name] ZOX:[id number] DOB: Apr 11, 1935 DOA: 11/25/2012  Referring physician: ER physician. PCP: Pcp Not In System  Chief Complaint: Fever.  HPI: Sarah Yang is a 78 y.o. female who was just discharged 2 days ago after being admitted for acute encephalopathy possibly secondary to UTI was brought to the ER after patient was found to be febrile and confused. In the ER patient was initially found to be mildly hypotensive with elevated lactic acid levels. Patient was given fluid bolus after which patient's blood pressure improved and lactic acid levels also improved. Patient was also found to be febrile. Blood cultures and urine cultures were sent. UA and chest x-ray were unremarkable. Patient has been admitted for further management. As per patient's daughter patient has been having swelling and pain in the left hand for last one week. Patient also has heel skin changes which has developed over the last 2 weeks. Patient also has left knee swelling which has been there for last few weeks. Patient otherwise did not have any nausea vomiting abdominal pain diarrhea chest pain or shortness of breath.  Review of Systems: As presented in the history of presenting illness, rest negative.  Past Medical History  Diagnosis Date  . Diabetes mellitus without complication   . Hypertension   . Dementia   . Hypothyroidism   . Alzheimer disease    Past Surgical History  Procedure Laterality Date  . Pacemaker insertion    . Nephrectomy  1989   Social History:  reports that she has never smoked. She has never used smokeless tobacco. She reports that she does not drink alcohol or use illicit drugs. Lives at nursing home. where does patient live-- Cannot do ADLs. Can patient participate in ADLs?  Allergies  Allergen Reactions  . Hydrocodone-Homatropine     Unknown  . Latex Rash    blisters    History reviewed. No pertinent family history.     Prior to Admission medications   Medication Sig Start Date End Date Taking? Authorizing Provider  acetaminophen (TYLENOL) 500 MG tablet Take 500 mg by mouth every 4 (four) hours as needed for pain (Max dose is 300 mg/24 hrs).   Yes Historical Provider, MD  alum & mag hydroxide-simeth (MAALOX/MYLANTA) 200-200-20 MG/5ML suspension Take 30 mLs by mouth 4 (four) times daily as needed for indigestion.   Yes Historical Provider, MD  aspirin 81 MG EC tablet Take 81 mg by mouth every morning. 09/18/12  Yes Ky Barban, MD  ciprofloxacin (CIPRO) 250 MG tablet Take 1 tablet (250 mg total) by mouth 2 (two) times daily. For 5 days 11/23/12  Yes Henderson Cloud, MD  donepezil (ARICEPT) 10 MG tablet Take 10 mg by mouth at bedtime.   Yes Historical Provider, MD  esomeprazole (NEXIUM) 40 MG capsule Take 40 mg by mouth daily before breakfast.   Yes Historical Provider, MD  guaiFENesin (ROBITUSSIN) 100 MG/5ML liquid Take 200 mg by mouth 4 (four) times daily as needed for cough.   Yes Historical Provider, MD  insulin aspart (NOVOLOG) 100 UNIT/ML injection Inject 0-12 Units into the skin 2 (two) times daily. Sliding scale; 251-300=6units, 301-350=8units, 351-400=10units, 401-450=12units; >450 call MD   Yes Historical Provider, MD  insulin glargine (LANTUS) 100 UNIT/ML injection Inject 8 Units into the skin at bedtime.   Yes Historical Provider, MD  levothyroxine (SYNTHROID, LEVOTHROID) 100 MCG tablet Take 100 mcg by mouth every morning.    Yes Historical Provider, MD  loperamide (  IMODIUM) 2 MG capsule Take 2 mg by mouth 4 (four) times daily as needed for diarrhea or loose stools.   Yes Historical Provider, MD  magnesium hydroxide (MILK OF MAGNESIA) 400 MG/5ML suspension Take 30 mLs by mouth at bedtime as needed for constipation.   Yes Historical Provider, MD  memantine (NAMENDA) 10 MG tablet Take 10 mg by mouth 2 (two) times daily.   Yes Historical Provider, MD  metoprolol tartrate (LOPRESSOR) 25 MG  tablet Take 25 mg by mouth 2 (two) times daily.   Yes Historical Provider, MD  nitroGLYCERIN (NITROSTAT) 0.4 MG SL tablet Place 0.4 mg under the tongue every 5 (five) minutes x 3 doses as needed for chest pain.    Yes Historical Provider, MD  pravastatin (PRAVACHOL) 20 MG tablet Take 20 mg by mouth at bedtime.   Yes Historical Provider, MD  Rivaroxaban (XARELTO) 20 MG TABS Take 20 mg by mouth daily at 6 PM.    Yes Historical Provider, MD  sertraline (ZOLOFT) 25 MG tablet Take 25 mg by mouth every morning.    Yes Historical Provider, MD   Physical Exam: Filed Vitals:   11/25/12 1800 11/25/12 1810 11/25/12 1900 11/25/12 1949  BP: 108/57 108/57 127/73 136/74  Pulse: 81 82 83 85  Temp:  98.7 F (37.1 C)    TempSrc:  Axillary    Resp: 24 29 22 21   Height:      Weight:      SpO2: 98% 98% 99% 100%     General:  Well-developed well-nourished.  Eyes: Anicteric no pallor.  ENT: No discharge from the ears eyes nose and mouth.  Neck: No mass felt.  Cardiovascular: S1-S2 heard.  Respiratory: No rhonchi or crepitations.  Abdomen: Soft nontender bowel sounds present.  Skin: Left heel has dark eschar.  Musculoskeletal: Patient's left hand is swollen and tender to touch. Patient is not able to make a fist.  Psychiatric: Alert awake and oriented to name.  Neurologic: Follows commands.  Labs on Admission:  Basic Metabolic Panel:  Recent Labs Lab 11/21/12 2138 11/21/12 2336 11/22/12 0505 11/25/12 1532 11/25/12 1642  NA 136 134* 138 135 136  K 3.8 3.7 3.8 4.1 3.9  CL 102 100 106 101 104  CO2  --  23 23 19   --   GLUCOSE 199* 197* 170* 259* 249*  BUN 15 14 13 18 18   CREATININE 1.20* 1.06 0.94 1.19* 1.00  CALCIUM  --  9.3 9.3 8.6  --    Liver Function Tests:  Recent Labs Lab 11/21/12 2336 11/22/12 0505 11/25/12 1532  AST 11 11 12   ALT 6 7 11   ALKPHOS 61 56 77  BILITOT 0.4 0.4 0.3  PROT 6.5 6.0 6.3  ALBUMIN 2.8* 2.6* 2.9*   No results found for this basename:  LIPASE, AMYLASE,  in the last 168 hours No results found for this basename: AMMONIA,  in the last 168 hours CBC:  Recent Labs Lab 11/21/12 2138 11/21/12 2336 11/23/12 0621 11/25/12 1532 11/25/12 1642  WBC  --  11.0* 9.4 12.6*  --   NEUTROABS  --  7.1 6.2 10.6*  --   HGB 10.2* 11.0* 9.8* 9.9* 9.5*  HCT 30.0* 33.4* 29.6* 29.3* 28.0*  MCV  --  81.7 81.3 80.5  --   PLT  --  199 214 261  --    Cardiac Enzymes: No results found for this basename: CKTOTAL, CKMB, CKMBINDEX, TROPONINI,  in the last 168 hours  BNP (last 3 results) No  results found for this basename: PROBNP,  in the last 8760 hours CBG:  Recent Labs Lab 11/22/12 1950 11/23/12 0001 11/23/12 0440 11/23/12 0738 11/23/12 1201  GLUCAP 155* 145* 158* 132* 195*    Radiological Exams on Admission: Dg Chest Port 1 View  (if Code Sepsis Called)  11/25/2012  *RADIOLOGY REPORT*  Clinical Data: Fever  PORTABLE CHEST - 1 VIEW  Comparison: None.  Findings: The heart and pulmonary vascularity are stable.  A pacing device is again seen.  The lungs are clear.  No pneumothorax is noted.  Visualized upper abdomen is unremarkable.  Chronic changes in the proximal right humerus are seen.  IMPRESSION: No acute abnormality noted.   Original Report Authenticated By: Alcide Clever, M.D.     EKG: Independently reviewed. Normal sinus rhythm. Monitor shows a paced rhythm.  Assessment/Plan Principal Problem:   Sepsis Active Problems:   Dementia   DM2 (diabetes mellitus, type 2)   Hypertension   Hypothyroidism   Cardiac pacemaker   Encephalopathy acute   Atrial fibrillation   1. Early sepsis - source is not clear. Potential sources could be patient's left hand swelling and left heel ulcer Blood cultures and urine cultures has been sent and patient has been empirically started on vancomycin and Zosyn. Check x-rays of the left heel and left knee. 2. Left hand swelling - at this time patient is left hand looks swollen and tender. But it is  not erythematous. We'll check blood cultures and also uric acid level. Recent x-ray done few days ago did not show any fractures. We will try to reach on-call hand surgeon for his opinion. 3. Atrial fibrillation and DVT on xarelto - presently I will place patient on heparin in case patient needs any procedures to be done in the hospital. Rate presently controlled. 4. Diabetes mellitus type 2 - continue home medications with sliding-scale coverage. 5. Hypothyroidism - continue Synthroid. Check TSH. 6. Dementia - continue home medications. Presently confused because of fever.    Code Status: Full code.  Family Communication: Patient's daughter at the bedside.  Disposition Plan: Admit to inpatient.    Zanaria Morell N. [facility] Pager [phone].  If 7PM-7AM, please contact night-coverage [email or web address] Password [facility] 11/25/2012, 8:27 PM

## 2012-11-25 NOTE — Progress Notes (Signed)
ANTICOAGULATION CONSULT NOTE - Initial Consult  Pharmacy Consult for heparin Indication: atrial fibrillation  Allergies  Allergen Reactions  . Hydrocodone-Homatropine     Unknown  . Latex Rash    blisters    Patient Measurements: Height: 5' 1.02" (155 cm) Weight: 123 lb 14.4 oz (56.2 kg) IBW/kg (Calculated) : 47.86 Heparin Dosing Weight: 56 kg  Vital Signs: Temp: 98.7 F (37.1 C) (05/09 2143) Temp src: Oral (05/09 2143) BP: 126/73 mmHg (05/09 2143) Pulse Rate: 88 (05/09 2143)  Labs:  Recent Labs  11/23/12 0621 11/25/12 1532 11/25/12 1642  HGB 9.8* 9.9* 9.5*  HCT 29.6* 29.3* 28.0*  PLT 214 261  --   CREATININE  --  1.19* 1.00    Estimated Creatinine Clearance: 36.8 ml/min (by C-G formula based on Cr of 1).   Medical History: Past Medical History  Diagnosis Date  . Diabetes mellitus without complication   . Hypertension   . Dementia   . Hypothyroidism   . Alzheimer disease     Medications:  Prescriptions prior to admission  Medication Sig Dispense Refill  . acetaminophen (TYLENOL) 500 MG tablet Take 500 mg by mouth every 4 (four) hours as needed for pain (Max dose is 300 mg/24 hrs).      Marland Kitchen alum & mag hydroxide-simeth (MAALOX/MYLANTA) 200-200-20 MG/5ML suspension Take 30 mLs by mouth 4 (four) times daily as needed for indigestion.      Marland Kitchen aspirin 81 MG EC tablet Take 81 mg by mouth every morning.      . ciprofloxacin (CIPRO) 250 MG tablet Take 1 tablet (250 mg total) by mouth 2 (two) times daily. For 5 days      . donepezil (ARICEPT) 10 MG tablet Take 10 mg by mouth at bedtime.      Marland Kitchen esomeprazole (NEXIUM) 40 MG capsule Take 40 mg by mouth daily before breakfast.      . guaiFENesin (ROBITUSSIN) 100 MG/5ML liquid Take 200 mg by mouth 4 (four) times daily as needed for cough.      . insulin aspart (NOVOLOG) 100 UNIT/ML injection Inject 0-12 Units into the skin 2 (two) times daily. Sliding scale; 251-300=6units, 301-350=8units, 351-400=10units, 401-450=12units;  >450 call MD      . insulin glargine (LANTUS) 100 UNIT/ML injection Inject 8 Units into the skin at bedtime.      Marland Kitchen levothyroxine (SYNTHROID, LEVOTHROID) 100 MCG tablet Take 100 mcg by mouth every morning.       . loperamide (IMODIUM) 2 MG capsule Take 2 mg by mouth 4 (four) times daily as needed for diarrhea or loose stools.      . magnesium hydroxide (MILK OF MAGNESIA) 400 MG/5ML suspension Take 30 mLs by mouth at bedtime as needed for constipation.      . memantine (NAMENDA) 10 MG tablet Take 10 mg by mouth 2 (two) times daily.      . metoprolol tartrate (LOPRESSOR) 25 MG tablet Take 25 mg by mouth 2 (two) times daily.      . nitroGLYCERIN (NITROSTAT) 0.4 MG SL tablet Place 0.4 mg under the tongue every 5 (five) minutes x 3 doses as needed for chest pain.       . pravastatin (PRAVACHOL) 20 MG tablet Take 20 mg by mouth at bedtime.      . Rivaroxaban (XARELTO) 20 MG TABS Take 20 mg by mouth daily at 6 PM.       . sertraline (ZOLOFT) 25 MG tablet Take 25 mg by mouth every morning.  Assessment: 78 yo F admitted from NH on Xarelto - last dose 11/24/12 at 1800 per Nursing Home Records.  Goal of Therapy:  Heparin level 0.3-0.7 units/ml Monitor platelets by anticoagulation protocol: Yes   Plan:  - Start heparin now (last Xarelto dose >24h ago) at a rate of 750 units/hr - Check 8h heparin level after drip starts - Check daily heparin level and CBC   Sarah Yang L. Illene Bolus, PharmD, BCPS Clinical Pharmacist Pager: [phone] Pharmacy: [phone] 11/25/2012 11:06 PM

## 2012-11-25 NOTE — ED Notes (Signed)
Paged Code Sepsis 

## 2012-11-25 NOTE — ED Notes (Signed)
BIB [facility]. Recent UTI diagnosis. Increasing generalized weakness since Wednesday. 102.9 from SNF. [facility]). 1g tylenol given by EMS. 20g Right forearm. CBG 400

## 2012-11-25 NOTE — ED Notes (Signed)
Code Sepsis called

## 2012-11-25 NOTE — ED Provider Notes (Signed)
History     CSN: [id number]  Arrival date & time 11/25/12  1504   None     Chief Complaint  Patient presents with  . Code Sepsis  . Weakness    (Consider location/radiation/quality/duration/timing/severity/associated sxs/prior treatment) Patient is a 78 y.o. female presenting with weakness. The history is provided by medical records (The daughter). No language interpreter was used.  Weakness This is a recurrent (Patient had recently been released from the hospital  on 11/22/2011. She had been hospitalized for weakness, hyperglycemia, and UTI. She has seemed to have gone downhill since she left the hospital,. ) problem. The current episode started more than 2 days ago (She has become lethargic, is not thinking clearly, has had high fever, and her blood sugar has been very high.). The problem occurs constantly. The problem has been gradually worsening. Pertinent negatives include no chest pain, no abdominal pain, no headaches and no shortness of breath. Nothing aggravates the symptoms. Nothing relieves the symptoms. She has tried acetaminophen for the symptoms. The treatment provided no relief.    Past Medical History  Diagnosis Date  . Diabetes mellitus without complication   . Hypertension   . Dementia   . Hypothyroidism   . Alzheimer disease     Past Surgical History  Procedure Laterality Date  . Pacemaker insertion    . Nephrectomy  1989    History reviewed. No pertinent family history.  History  Substance Use Topics  . Smoking status: Never Smoker   . Smokeless tobacco: Never Used  . Alcohol Use: No    OB History   Grav Para Term Preterm Abortions TAB SAB Ect Mult Living                  Review of Systems  Constitutional: Positive for fever.  HENT: Negative.   Eyes: Negative.   Respiratory: Negative.  Negative for shortness of breath.   Cardiovascular: Negative for chest pain.  Gastrointestinal: Negative.  Negative for abdominal pain.  Genitourinary:        History of recent UTI.  Musculoskeletal: Negative.   Skin:       Sweating.  Neurological: Positive for weakness. Negative for headaches.  Psychiatric/Behavioral: Positive for confusion.    Allergies  Hydrocodone-homatropine and Latex  Home Medications   Current Outpatient Rx  Name  Route  Sig  Dispense  Refill  . acetaminophen (TYLENOL) 500 MG tablet   Oral   Take 500 mg by mouth every 4 (four) hours as needed for pain (Max dose is 300 mg/24 hrs).         Marland Kitchen aspirin 81 MG EC tablet   Oral   Take 81 mg by mouth every morning.         . ciprofloxacin (CIPRO) 250 MG tablet   Oral   Take 1 tablet (250 mg total) by mouth 2 (two) times daily. For 5 days         . donepezil (ARICEPT) 10 MG tablet   Oral   Take 10 mg by mouth at bedtime.         Marland Kitchen esomeprazole (NEXIUM) 40 MG capsule   Oral   Take 40 mg by mouth daily before breakfast.         . insulin glargine (LANTUS) 100 UNIT/ML injection   Subcutaneous   Inject 0.04 mLs (4 Units total) into the skin daily.   10 mL      . insulin lispro (HUMALOG) 100 UNIT/ML injection   Subcutaneous  Inject 0-15 Units into the skin 3 (three) times daily before meals. Per sliding scale         . levothyroxine (SYNTHROID, LEVOTHROID) 100 MCG tablet   Oral   Take 100 mcg by mouth every morning.          . memantine (NAMENDA) 10 MG tablet   Oral   Take 10 mg by mouth 2 (two) times daily.         . metoprolol tartrate (LOPRESSOR) 25 MG tablet   Oral   Take 25 mg by mouth 2 (two) times daily.         . nitroGLYCERIN (NITROSTAT) 0.4 MG SL tablet   Sublingual   Place 0.4 mg under the tongue every 5 (five) minutes as needed for chest pain.         . pravastatin (PRAVACHOL) 20 MG tablet   Oral   Take 20 mg by mouth at bedtime.         . Rivaroxaban (XARELTO) 20 MG TABS   Oral   Take 20 mg by mouth daily at 6 PM.          . sertraline (ZOLOFT) 25 MG tablet   Oral   Take 25 mg by mouth every morning.             BP 107/58  Pulse 84  Temp(Src) 102.7 F (39.3 C) (Rectal)  Resp 26  SpO2 96%  Physical Exam  Nursing note and vitals reviewed. Constitutional: She appears well-developed and well-nourished. She appears distressed.  T 102.7.  Pt looks acutely and chronically ill.  She is awake and can answer simple questions.  HENT:  Head: Normocephalic and atraumatic.  Right Ear: External ear normal.  Left Ear: External ear normal.  Mouth/Throat: Oropharynx is clear and moist.  Eyes: Conjunctivae and EOM are normal. Pupils are equal, round, and reactive to light. No scleral icterus.  Neck: Normal range of motion. Neck supple.  Cardiovascular: Normal rate, regular rhythm and normal heart sounds.   Pulmonary/Chest: Effort normal. No respiratory distress. She has no wheezes. She has rales.  Abdominal: Soft. Bowel sounds are normal. She exhibits no distension. There is no tenderness.  Musculoskeletal: Normal range of motion. She exhibits no edema.  She has tenderness and swelling over the dorsum of the left wrist and left hand, with some bruising and swelling of the left hand.  There is no palpable bony deformity.  She is able to move her fingers.  Neurological:  Awake, answers simple questions.  Sensory and motor intact.  Skin: Skin is warm.  Skin feels sweaty.  Psychiatric:  Awake, answers simple questions with "Yes" or "No."  Unable to give her history.    ED Course  CRITICAL CARE Performed by: Osvaldo Human Authorized by: Osvaldo Human Total critical care time: 30 minutes Critical care was necessary to treat or prevent imminent or life-threatening deterioration of the following conditions: 78 yo woman from nursing home, recently discharged from hospital post treatment for UTI, brought in by EMS with high fever, altered mental status, BP appx 100 systolic.  Resuscitated with IV fluids and IV antibiotics, had critical care consult, Stepdown ad. Critical care was time spent  personally by me on the following activities: discussions with consultants, evaluation of patient's response to treatment, examination of patient, obtaining history from patient or surrogate, ordering and performing treatments and interventions, ordering and review of laboratory studies, ordering and review of radiographic studies, re-evaluation of patient's condition and review  of old charts.   (including critical care time)  Labs Reviewed  CULTURE, BLOOD (ROUTINE X 2)  CULTURE, BLOOD (ROUTINE X 2)  URINE CULTURE  CBC WITH DIFFERENTIAL  COMPREHENSIVE METABOLIC PANEL  URINALYSIS, ROUTINE W REFLEX MICROSCOPIC    3:52 PM Pt. Seen STAT upon arrival.  CODE Sepsis called.  Old charts reviewed.  IV fluids, IV antibiotics for presumed urosepsis ordered.  6:30 PM BP holding steady appx 110 systolic.  Lab workup shows no source of her infection, but her lactate remains over 4 despite 30 ml/kg IV fluids.  Will repeat IV fluid challenge, call PCCM to admit her.  8:13 PM  Date: 11/25/2012  Rate:81  Rhythm: normal sinus rhythm  QRS Axis: normal  Intervals: QT prolonged  ST/T Wave abnormalities: nonspecific T wave changes  Conduction Disutrbances:none  Narrative Interpretation: Borderline EKG  Old EKG Reviewed: changes noted--Occasional pacemaker spikes were noted on EKG done 11/21/2012.  8:40 PM Pt looking much better.  I called PCCM. They have spoken to Dr. Toniann Fail of [facility], with the plan that [facility].   1. Sepsis   2. Atrial fibrillation   3. Dementia   4. Encephalopathy acute            Carleene Cooper III, MD 11/25/12 2044

## 2012-11-26 DIAGNOSIS — E119 Type 2 diabetes mellitus without complications: Secondary | ICD-10-CM

## 2012-11-26 DIAGNOSIS — M7989 Other specified soft tissue disorders: Secondary | ICD-10-CM

## 2012-11-26 LAB — CBC WITH DIFFERENTIAL/PLATELET
Hemoglobin: 8.7 g/dL — ABNORMAL LOW (ref 12.0–15.0)
Lymphs Abs: 2 10*3/uL (ref 0.7–4.0)
Monocytes Relative: 9 % (ref 3–12)
Neutro Abs: 5.1 10*3/uL (ref 1.7–7.7)
Neutrophils Relative %: 64 % (ref 43–77)
RBC: 3.2 MIL/uL — ABNORMAL LOW (ref 3.87–5.11)

## 2012-11-26 LAB — URINE CULTURE
Colony Count: NO GROWTH
Culture: NO GROWTH

## 2012-11-26 LAB — APTT: aPTT: 58 seconds — ABNORMAL HIGH (ref 24–37)

## 2012-11-26 LAB — COMPREHENSIVE METABOLIC PANEL
AST: 13 U/L (ref 0–37)
CO2: 21 mEq/L (ref 19–32)
Calcium: 8.9 mg/dL (ref 8.4–10.5)
Creatinine, Ser: 0.88 mg/dL (ref 0.50–1.10)
GFR calc Af Amer: 73 mL/min — ABNORMAL LOW (ref >90)
GFR calc non Af Amer: 63 mL/min — ABNORMAL LOW (ref >90)
Glucose, Bld: 132 mg/dL — ABNORMAL HIGH (ref 70–99)
Sodium: 138 mEq/L (ref 135–145)

## 2012-11-26 LAB — URIC ACID: Uric Acid, Serum: 3.3 mg/dL (ref 2.4–7.0)

## 2012-11-26 LAB — LACTIC ACID, PLASMA: Lactic Acid, Venous: 1.6 mmol/L (ref 0.5–2.2)

## 2012-11-26 LAB — GLUCOSE, CAPILLARY: Glucose-Capillary: 148 mg/dL — ABNORMAL HIGH (ref 70–99)

## 2012-11-26 NOTE — Progress Notes (Signed)
[facility] Progress Note Sarah Yang OZH:[id number] DOB: Mar 08, 1935 DOA: 11/25/2012 PCP: Pcp Not In System  Brief narrative: Sarah Yang is a 78 y.o. female who was just discharged 2 days ago after being admitted for acute encephalopathy possibly secondary to UTI was brought to the ER after patient was found to be febrile and confused. In the ER patient was initially found to be mildly hypotensive with elevated lactic acid levels. Patient was given fluid bolus after which patient's blood pressure improved and lactic acid levels also improved. Patient was also found to be febrile. Blood cultures and urine cultures were sent. UA and chest x-ray were unremarkable. Patient has been admitted for further management. As per patient's daughter patient has been having swelling and pain in the left hand for last one week. Patient also has heel skin changes which has developed over the last 2 weeks. Patient also has left knee swelling which has been there for last few weeks. Patient otherwise did not have any nausea vomiting abdominal pain diarrhea chest pain or shortness of breath.   Assessment/Plan: Principal Problem:   Sepsis -possible source is left hand cellulitis - cont Vanc and Zosyn  Active Problems:   Encephalopathy acute  -likely due to sepsis  Swelling left knee -chronic and likely osteoarthritis    Dementia    DM2 (diabetes mellitus, type 2) - cont current dosed of lantus and novolog    Hypertension - cont metoprolol    Hypothyroidism - cont levothyroxine    Atrial fibrillation/  Cardiac pacemaker - currently NSR    Code Status: full code Family Communication: with daughters Disposition Plan: follow in SDU   Consultants: none  Procedures: none  Antibiotics: Zosyn/ Vancomycin 5/9  DVT prophylaxis: Heparin  HPI/Subjective: Pt alert and has no complaints   Objective: Blood pressure 150/93, pulse 82,  temperature 98.7 F (37.1 C), temperature source Oral, resp. rate 14, height 5' 1.02" (1.55 m), weight 63.7 kg (140 lb 6.9 oz), SpO2 100.00%.  Intake/Output Summary (Last 24 hours) at 11/26/12 1807 Last data filed at 11/26/12 1400  Gross per 24 hour  Intake 1793.05 ml  Output      0 ml  Net 1793.05 ml     Exam: General: No acute respiratory distress Lungs: Clear to auscultation bilaterally without wheezes or crackles Cardiovascular: Regular rate and rhythm without murmur gallop or rub normal S1 and S2 Abdomen: Nontender, nondistended, soft, bowel sounds positive, no rebound, no ascites, no appreciable mass Extremities: No significant cyanosis, clubbing, or edema bilateral lower extremities, able to flex and extend at left knee- mild swelling present in knee.   Data Reviewed: Basic Metabolic Panel:  Recent Labs Lab 11/21/12 2336 11/22/12 0505 11/25/12 1532 11/25/12 1642 11/26/12 0829  NA 134* 138 135 136 138  K 3.7 3.8 4.1 3.9 3.7  CL 100 106 101 104 107  CO2 23 23 19   --  21  GLUCOSE 197* 170* 259* 249* 132*  BUN 14 13 18 18 11   CREATININE 1.06 0.94 1.19* 1.00 0.88  CALCIUM 9.3 9.3 8.6  --  8.9   Liver Function Tests:  Recent Labs Lab 11/21/12 2336 11/22/12 0505 11/25/12 1532 11/26/12 0829  AST 11 11 12 13   ALT 6 7 11 8   ALKPHOS 61 56 77 64  BILITOT 0.4 0.4 0.3 0.4  PROT 6.5 6.0 6.3 5.6*  ALBUMIN 2.8* 2.6* 2.9* 2.3*   No results found for this basename: LIPASE,  AMYLASE,  in the last 168 hours No results found for this basename: AMMONIA,  in the last 168 hours CBC:  Recent Labs Lab 11/21/12 2336 11/23/12 0621 11/25/12 1532 11/25/12 1642 11/26/12 0829  WBC 11.0* 9.4 12.6*  --  8.0  NEUTROABS 7.1 6.2 10.6*  --  5.1  HGB 11.0* 9.8* 9.9* 9.5* 8.7*  HCT 33.4* 29.6* 29.3* 28.0* 26.2*  MCV 81.7 81.3 80.5  --  81.9  PLT 199 214 261  --  206   Cardiac Enzymes: No results found for this basename: CKTOTAL, CKMB, CKMBINDEX, TROPONINI,  in the last 168  hours BNP (last 3 results) No results found for this basename: PROBNP,  in the last 8760 hours CBG:  Recent Labs Lab 11/23/12 1201 11/25/12 2224 11/26/12 0737 11/26/12 1244 11/26/12 1624  GLUCAP 195* 158* 132* 148* 187*    Recent Results (from the past 240 hour(s))  URINE CULTURE     Status: None   Collection Time    11/21/12 10:28 PM      Result Value Range Status   Specimen Description URINE, CATHETERIZED   Final   Special Requests NONE   Final   Culture  Setup Time 11/22/2012 18:48   Final   Colony Count NO GROWTH   Final   Culture NO GROWTH   Final   Report Status 11/23/2012 FINAL   Final  CULTURE, BLOOD (ROUTINE X 2)     Status: None   Collection Time    11/21/12 11:36 PM      Result Value Range Status   Specimen Description BLOOD LEFT WRIST   Final   Special Requests BOTTLES DRAWN AEROBIC AND ANAEROBIC   Final   Culture  Setup Time 11/22/2012 03:49   Final   Culture     Final   Value:        BLOOD CULTURE RECEIVED NO GROWTH TO DATE CULTURE WILL BE HELD FOR 5 DAYS BEFORE ISSUING A FINAL NEGATIVE REPORT   Report Status PENDING   Incomplete  CULTURE, BLOOD (ROUTINE X 2)     Status: None   Collection Time    11/21/12 11:43 PM      Result Value Range Status   Specimen Description BLOOD RIGHT ARM   Final   Special Requests BOTTLES DRAWN AEROBIC AND ANAEROBIC 5CC   Final   Culture  Setup Time 11/22/2012 03:49   Final   Culture     Final   Value:        BLOOD CULTURE RECEIVED NO GROWTH TO DATE CULTURE WILL BE HELD FOR 5 DAYS BEFORE ISSUING A FINAL NEGATIVE REPORT   Report Status PENDING   Incomplete  MRSA PCR SCREENING     Status: None   Collection Time    11/22/12  6:40 AM      Result Value Range Status   MRSA by PCR NEGATIVE  NEGATIVE Final   Comment:            The GeneXpert MRSA Assay (FDA     approved for NASAL specimens     only), is one component of a     comprehensive MRSA colonization     surveillance program. It is not     intended to diagnose MRSA      infection nor to guide or     monitor treatment for     MRSA infections.     Studies:  Recent x-ray studies have been reviewed in detail by the Attending Physician  Scheduled Meds:  Scheduled Meds: . aspirin  81 mg Oral q morning - 10a  . donepezil  10 mg Oral QHS  . insulin aspart  0-9 Units Subcutaneous TID WC  . insulin glargine  8 Units Subcutaneous QHS  . levothyroxine  100 mcg Oral q morning - 10a  . memantine  10 mg Oral BID  . metoprolol tartrate  25 mg Oral BID  . pantoprazole  40 mg Oral Daily  . piperacillin-tazobactam (ZOSYN)  IV  3.375 g Intravenous Q8H  . sertraline  25 mg Oral q morning - 10a  . simvastatin  10 mg Oral q1800  . sodium chloride  3 mL Intravenous Q12H  . vancomycin  750 mg Intravenous Q24H   Continuous Infusions: . sodium chloride 100 mL/hr at 11/26/12 1100  . heparin 950 Units/hr (11/26/12 1804)    Time spent on care of this patient:   Calvert Cantor, MD [phone]  [facility] Office  [phone] Pager - Text Page per Amion as per below:  On-Call/Text Page:      [email or web address]      password [email or web address]  If 7PM-7AM, please contact night-coverage [email or web address] Password [email or web address] 11/26/2012, 6:07 PM   LOS: 1 day

## 2012-11-26 NOTE — Progress Notes (Signed)
ANTICOAGULATION CONSULT NOTE - Initial Consult  Pharmacy Consult for Heparin (while Xarelto on hold) Indication: atrial fibrillation  Allergies  Allergen Reactions  . Hydrocodone-Homatropine     Unknown  . Latex Rash    blisters    Patient Measurements: Height: 5' 1.02" (155 cm) Weight: 140 lb 6.9 oz (63.7 kg) IBW/kg (Calculated) : 47.86 Heparin Dosing Weight: 56 kg  Vital Signs: Temp: 98.7 F (37.1 C) (05/10 1625) Temp src: Oral (05/10 1625) BP: 150/93 mmHg (05/10 1625) Pulse Rate: 82 (05/10 1625)  Labs:  Recent Labs  11/25/12 1532 11/25/12 1642 11/26/12 0829 11/26/12 1729  HGB 9.9* 9.5* 8.7*  --   HCT 29.3* 28.0* 26.2*  --   PLT 261  --  206  --   APTT  --   --  58*  --   HEPARINUNFRC  --   --  0.42 0.17*  CREATININE 1.19* 1.00 0.88  --     Estimated Creatinine Clearance: 47.3 ml/min (by C-G formula based on Cr of 0.88).   Medical History: Past Medical History  Diagnosis Date  . Diabetes mellitus without complication   . Hypertension   . Dementia   . Hypothyroidism   . Alzheimer disease     Medications:  Prescriptions prior to admission  Medication Sig Dispense Refill  . acetaminophen (TYLENOL) 500 MG tablet Take 500 mg by mouth every 4 (four) hours as needed for pain (Max dose is 300 mg/24 hrs).      Marland Kitchen alum & mag hydroxide-simeth (MAALOX/MYLANTA) 200-200-20 MG/5ML suspension Take 30 mLs by mouth 4 (four) times daily as needed for indigestion.      Marland Kitchen aspirin 81 MG EC tablet Take 81 mg by mouth every morning.      . ciprofloxacin (CIPRO) 250 MG tablet Take 1 tablet (250 mg total) by mouth 2 (two) times daily. For 5 days      . donepezil (ARICEPT) 10 MG tablet Take 10 mg by mouth at bedtime.      Marland Kitchen esomeprazole (NEXIUM) 40 MG capsule Take 40 mg by mouth daily before breakfast.      . guaiFENesin (ROBITUSSIN) 100 MG/5ML liquid Take 200 mg by mouth 4 (four) times daily as needed for cough.      . insulin aspart (NOVOLOG) 100 UNIT/ML injection Inject 0-12  Units into the skin 2 (two) times daily. Sliding scale; 251-300=6units, 301-350=8units, 351-400=10units, 401-450=12units; >450 call MD      . insulin glargine (LANTUS) 100 UNIT/ML injection Inject 8 Units into the skin at bedtime.      Marland Kitchen levothyroxine (SYNTHROID, LEVOTHROID) 100 MCG tablet Take 100 mcg by mouth every morning.       . loperamide (IMODIUM) 2 MG capsule Take 2 mg by mouth 4 (four) times daily as needed for diarrhea or loose stools.      . magnesium hydroxide (MILK OF MAGNESIA) 400 MG/5ML suspension Take 30 mLs by mouth at bedtime as needed for constipation.      . memantine (NAMENDA) 10 MG tablet Take 10 mg by mouth 2 (two) times daily.      . metoprolol tartrate (LOPRESSOR) 25 MG tablet Take 25 mg by mouth 2 (two) times daily.      . nitroGLYCERIN (NITROSTAT) 0.4 MG SL tablet Place 0.4 mg under the tongue every 5 (five) minutes x 3 doses as needed for chest pain.       . pravastatin (PRAVACHOL) 20 MG tablet Take 20 mg by mouth at bedtime.      Marland Kitchen  Rivaroxaban (XARELTO) 20 MG TABS Take 20 mg by mouth daily at 6 PM.       . sertraline (ZOLOFT) 25 MG tablet Take 25 mg by mouth every morning.         Assessment: 78 yo F admitted from NH on Xarelto for atrial fibrillation - last dose 11/24/12. To continue on heparin per pharmacy in case need for any procedures. HL this evening is SUBtherapeutic (0.17, goal of 0.3-0.7). No overt s/sx of bleeding noted.  Goal of Therapy:  Heparin level 0.3-0.7 units/ml Monitor platelets by anticoagulation protocol: Yes   Plan:  1. Increase heparin drip rate to 950 units/hr (9.5 ml/hr) 2. Will continue to monitor for any signs/symptoms of bleeding and will follow up with heparin level in 8 hours   Georgina Pillion, PharmD, BCPS Clinical Pharmacist Pager: [phone] 11/26/2012 6:01 PM

## 2012-11-26 NOTE — Progress Notes (Signed)
ANTICOAGULATION CONSULT NOTE - Initial Consult  Pharmacy Consult for heparin Indication: atrial fibrillation  Allergies  Allergen Reactions  . Hydrocodone-Homatropine     Unknown  . Latex Rash    blisters    Patient Measurements: Height: 5' 1.02" (155 cm) Weight: 140 lb 6.9 oz (63.7 kg) IBW/kg (Calculated) : 47.86 Heparin Dosing Weight: 56 kg  Vital Signs: Temp: 98.5 F (36.9 C) (05/10 0730) Temp src: Oral (05/10 0730) BP: 117/62 mmHg (05/10 1100) Pulse Rate: 82 (05/10 1100)  Labs:  Recent Labs  11/25/12 1532 11/25/12 1642 11/26/12 0829  HGB 9.9* 9.5* 8.7*  HCT 29.3* 28.0* 26.2*  PLT 261  --  206  APTT  --   --  58*  HEPARINUNFRC  --   --  0.42  CREATININE 1.19* 1.00 0.88    Estimated Creatinine Clearance: 47.3 ml/min (by C-G formula based on Cr of 0.88).   Medical History: Past Medical History  Diagnosis Date  . Diabetes mellitus without complication   . Hypertension   . Dementia   . Hypothyroidism   . Alzheimer disease     Medications:  Prescriptions prior to admission  Medication Sig Dispense Refill  . acetaminophen (TYLENOL) 500 MG tablet Take 500 mg by mouth every 4 (four) hours as needed for pain (Max dose is 300 mg/24 hrs).      Marland Kitchen alum & mag hydroxide-simeth (MAALOX/MYLANTA) 200-200-20 MG/5ML suspension Take 30 mLs by mouth 4 (four) times daily as needed for indigestion.      Marland Kitchen aspirin 81 MG EC tablet Take 81 mg by mouth every morning.      . ciprofloxacin (CIPRO) 250 MG tablet Take 1 tablet (250 mg total) by mouth 2 (two) times daily. For 5 days      . donepezil (ARICEPT) 10 MG tablet Take 10 mg by mouth at bedtime.      Marland Kitchen esomeprazole (NEXIUM) 40 MG capsule Take 40 mg by mouth daily before breakfast.      . guaiFENesin (ROBITUSSIN) 100 MG/5ML liquid Take 200 mg by mouth 4 (four) times daily as needed for cough.      . insulin aspart (NOVOLOG) 100 UNIT/ML injection Inject 0-12 Units into the skin 2 (two) times daily. Sliding scale;  251-300=6units, 301-350=8units, 351-400=10units, 401-450=12units; >450 call MD      . insulin glargine (LANTUS) 100 UNIT/ML injection Inject 8 Units into the skin at bedtime.      Marland Kitchen levothyroxine (SYNTHROID, LEVOTHROID) 100 MCG tablet Take 100 mcg by mouth every morning.       . loperamide (IMODIUM) 2 MG capsule Take 2 mg by mouth 4 (four) times daily as needed for diarrhea or loose stools.      . magnesium hydroxide (MILK OF MAGNESIA) 400 MG/5ML suspension Take 30 mLs by mouth at bedtime as needed for constipation.      . memantine (NAMENDA) 10 MG tablet Take 10 mg by mouth 2 (two) times daily.      . metoprolol tartrate (LOPRESSOR) 25 MG tablet Take 25 mg by mouth 2 (two) times daily.      . nitroGLYCERIN (NITROSTAT) 0.4 MG SL tablet Place 0.4 mg under the tongue every 5 (five) minutes x 3 doses as needed for chest pain.       . pravastatin (PRAVACHOL) 20 MG tablet Take 20 mg by mouth at bedtime.      . Rivaroxaban (XARELTO) 20 MG TABS Take 20 mg by mouth daily at 6 PM.       .  sertraline (ZOLOFT) 25 MG tablet Take 25 mg by mouth every morning.         Assessment: 78 yo F admitted from NH on Xarelto for atrial fibrillation - last dose 11/24/12. To continue on heparin per pharmacy in case need for any procedures. HL this AM 0.42 is therapeutic. CBC slight trend down, no noted bleeding.  Goal of Therapy:  Heparin level 0.3-0.7 units/ml Monitor platelets by anticoagulation protocol: Yes   Plan:  - Continue heparin gtt at a rate of 750 units/hr - Check 8h heparin level to confirm therapeutic - Check daily heparin level and CBC - F/u resumption of xarelto  Bola A. Wandra Feinstein D Clinical Pharmacist Pager:[phone] Phone [phone] 11/26/2012 11:15 AM

## 2012-11-26 NOTE — Progress Notes (Signed)
VASCULAR LAB PRELIMINARY  PRELIMINARY  PRELIMINARY  PRELIMINARY  Left upper extremity venous Doppler completed.    Preliminary report:  There is no DVT or SVT noted in the left upper extremity.  Jaziah Goeller, RVT 11/26/2012, 5:05 PM

## 2012-11-27 DIAGNOSIS — L0390 Cellulitis, unspecified: Secondary | ICD-10-CM

## 2012-11-27 LAB — CBC
Hemoglobin: 8.7 g/dL — ABNORMAL LOW (ref 12.0–15.0)
MCH: 26.9 pg (ref 26.0–34.0)
MCHC: 33.1 g/dL (ref 30.0–36.0)
MCV: 81.2 fL (ref 78.0–100.0)
RBC: 3.24 MIL/uL — ABNORMAL LOW (ref 3.87–5.11)

## 2012-11-27 LAB — GLUCOSE, CAPILLARY
Glucose-Capillary: 125 mg/dL — ABNORMAL HIGH (ref 70–99)
Glucose-Capillary: 173 mg/dL — ABNORMAL HIGH (ref 70–99)

## 2012-11-27 MED ORDER — HEPARIN (PORCINE) IN NACL 100-0.45 UNIT/ML-% IJ SOLN: 1100.0000 [IU]/h | INTRAMUSCULAR | Status: DC

## 2012-11-27 MED ORDER — RIVAROXABAN 15 MG PO TABS
15.0000 mg | ORAL_TABLET | Freq: Every day | ORAL | Status: DC
  Administered 2012-11-27 – 2012-11-29 (×3): 15 mg via ORAL
  Filled 2012-11-27 (×3): qty 1

## 2012-11-27 MED ORDER — RIVAROXABAN 20 MG PO TABS
20.0000 mg | ORAL_TABLET | Freq: Every day | ORAL | Status: DC
Start: 2012-11-27 — End: 2012-11-27
  Filled 2012-11-27: qty 1

## 2012-11-27 MED ORDER — SODIUM CHLORIDE 0.9 % IV SOLN
500.0000 mg | Freq: Two times a day (BID) | INTRAVENOUS | Status: DC
  Filled 2012-11-27: qty 500

## 2012-11-27 NOTE — Progress Notes (Addendum)
ANTICOAGULATION CONSULT NOTE - Initial Consult  Pharmacy Consult for Heparin (while Xarelto on hold) Indication: atrial fibrillation  Allergies  Allergen Reactions  . Hydrocodone-Homatropine     Unknown  . Latex Rash    blisters    Patient Measurements: Height: 5' 1.02" (155 cm) Weight: 139 lb 12.4 oz (63.4 kg) IBW/kg (Calculated) : 47.86 Heparin Dosing Weight: 56 kg  Vital Signs: Temp: 98.5 F (36.9 C) (05/11 0400) Temp src: Oral (05/10 2326) BP: 121/58 mmHg (05/11 0400) Pulse Rate: 81 (05/11 0400)  Labs:  Recent Labs  11/25/12 1532 11/25/12 1642 11/26/12 0829 11/26/12 1729 11/27/12 0425  HGB 9.9* 9.5* 8.7*  --  8.7*  HCT 29.3* 28.0* 26.2*  --  26.3*  PLT 261  --  206  --  232  APTT  --   --  58*  --   --   HEPARINUNFRC  --   --  0.42 0.17* 0.25*  CREATININE 1.19* 1.00 0.88  --   --     Estimated Creatinine Clearance: 47.2 ml/min (by C-G formula based on Cr of 0.88).   Medical History: Past Medical History  Diagnosis Date  . Diabetes mellitus without complication   . Hypertension   . Dementia   . Hypothyroidism   . Alzheimer disease     Medications:  Prescriptions prior to admission  Medication Sig Dispense Refill  . acetaminophen (TYLENOL) 500 MG tablet Take 500 mg by mouth every 4 (four) hours as needed for pain (Max dose is 300 mg/24 hrs).      Marland Kitchen alum & mag hydroxide-simeth (MAALOX/MYLANTA) 200-200-20 MG/5ML suspension Take 30 mLs by mouth 4 (four) times daily as needed for indigestion.      Marland Kitchen aspirin 81 MG EC tablet Take 81 mg by mouth every morning.      . ciprofloxacin (CIPRO) 250 MG tablet Take 1 tablet (250 mg total) by mouth 2 (two) times daily. For 5 days      . donepezil (ARICEPT) 10 MG tablet Take 10 mg by mouth at bedtime.      Marland Kitchen esomeprazole (NEXIUM) 40 MG capsule Take 40 mg by mouth daily before breakfast.      . guaiFENesin (ROBITUSSIN) 100 MG/5ML liquid Take 200 mg by mouth 4 (four) times daily as needed for cough.      . insulin  aspart (NOVOLOG) 100 UNIT/ML injection Inject 0-12 Units into the skin 2 (two) times daily. Sliding scale; 251-300=6units, 301-350=8units, 351-400=10units, 401-450=12units; >450 call MD      . insulin glargine (LANTUS) 100 UNIT/ML injection Inject 8 Units into the skin at bedtime.      Marland Kitchen levothyroxine (SYNTHROID, LEVOTHROID) 100 MCG tablet Take 100 mcg by mouth every morning.       . loperamide (IMODIUM) 2 MG capsule Take 2 mg by mouth 4 (four) times daily as needed for diarrhea or loose stools.      . magnesium hydroxide (MILK OF MAGNESIA) 400 MG/5ML suspension Take 30 mLs by mouth at bedtime as needed for constipation.      . memantine (NAMENDA) 10 MG tablet Take 10 mg by mouth 2 (two) times daily.      . metoprolol tartrate (LOPRESSOR) 25 MG tablet Take 25 mg by mouth 2 (two) times daily.      . nitroGLYCERIN (NITROSTAT) 0.4 MG SL tablet Place 0.4 mg under the tongue every 5 (five) minutes x 3 doses as needed for chest pain.       . pravastatin (PRAVACHOL) 20 MG  tablet Take 20 mg by mouth at bedtime.      . Rivaroxaban (XARELTO) 20 MG TABS Take 20 mg by mouth daily at 6 PM.       . sertraline (ZOLOFT) 25 MG tablet Take 25 mg by mouth every morning.         Assessment: Heparin level remains subtherapeutic. No bleeding noted.   Goal of Therapy:  Heparin level 0.3-0.7 units/ml Monitor platelets by anticoagulation protocol: Yes   Plan:  Increase rate to 1100 units/hr and recheck in am of 5/12  Continue to monitor for any bleeding.Janice Coffin '

## 2012-11-27 NOTE — Progress Notes (Signed)
[facility] Progress Note [facility] TEAM 1 - Stepdown/ICU TEAM   Sarah Yang ZOX:[id number] DOB: 05/17/1935 DOA: 11/25/2012 PCP: Pcp Not In System  Brief narrative: Sarah Yang is a 78 y.o. female who was just discharged 2 days ago after being admitted for acute encephalopathy possibly secondary to UTI was brought to the ER after patient was found to be febrile and confused. In the ER patient was initially found to be mildly hypotensive with elevated lactic acid levels. Patient was given fluid bolus after which patient's blood pressure improved and lactic acid levels also improved. Patient was also found to be febrile. Blood cultures and urine cultures were sent. UA and chest x-ray were unremarkable. Patient has been admitted for further management. As per patient's daughter patient has been having swelling and pain in the left hand for last one week. Patient also has heel skin changes which has developed over the last 2 weeks. Patient also has left knee swelling which has been there for last few weeks. Patient otherwise did not have any nausea vomiting abdominal pain diarrhea chest pain or shortness of breath.   Assessment/Plan: Principal Problem:   Sepsis - source likely is left hand cellulitis - cultures negative - d/c Vanc today - cont Zosyn - doppler left arm negative for DVT  Active Problems:   Encephalopathy acute  -likely due to sepsis and delirium (underlying dementia)  Swelling left knee -chronic and likely osteoarthritis    Dementia    DM2 (diabetes mellitus, type 2) - cont current dose of lantus and novolog    Hypertension - cont metoprolol    Hypothyroidism - cont levothyroxine    Atrial fibrillation/  Cardiac pacemaker - currently NSR    Code Status: full code Family Communication: with daughter at bedside Disposition Plan: transfer to med/surg- start PT- has home health PT   Consultants: none  Procedures: none  Antibiotics: Zosyn-  5/9>> Vancomycin 5/9>>5/11  DVT prophylaxis: Heparin  HPI/Subjective: Pt alert and has no complaints- very pleasant but slightly confused.    Objective: Blood pressure 139/94, pulse 84, temperature 98.4 F (36.9 C), temperature source Oral, resp. rate 12, height 5' 1.02" (1.55 m), weight 63.4 kg (139 lb 12.4 oz), SpO2 98.00%.  Intake/Output Summary (Last 24 hours) at 11/27/12 1626 Last data filed at 11/27/12 1600  Gross per 24 hour  Intake 2226.45 ml  Output      0 ml  Net 2226.45 ml     Exam: General: AA and mildly disoriented, No acute respiratory distress Lungs: Clear to auscultation bilaterally without wheezes or crackles Cardiovascular: Regular rate and rhythm without murmur gallop or rub normal S1 and S2 Abdomen: Nontender, nondistended, soft, bowel sounds positive, no rebound, no ascites, no appreciable mass Extremities: No significant cyanosis, clubbing, or edema bilateral lower extremities, able to flex and extend at left knee- mild swelling present in knee.   Data Reviewed: Basic Metabolic Panel:  Recent Labs Lab 11/21/12 2336 11/22/12 0505 11/25/12 1532 11/25/12 1642 11/26/12 0829  NA 134* 138 135 136 138  K 3.7 3.8 4.1 3.9 3.7  CL 100 106 101 104 107  CO2 23 23 19   --  21  GLUCOSE 197* 170* 259* 249* 132*  BUN 14 13 18 18 11   CREATININE 1.06 0.94 1.19* 1.00 0.88  CALCIUM 9.3 9.3 8.6  --  8.9   Liver Function Tests:  Recent Labs Lab 11/21/12 2336 11/22/12 0505 11/25/12 1532 11/26/12 0829  AST 11 11 12 13   ALT 6  7 11 8   ALKPHOS 61 56 77 64  BILITOT 0.4 0.4 0.3 0.4  PROT 6.5 6.0 6.3 5.6*  ALBUMIN 2.8* 2.6* 2.9* 2.3*   No results found for this basename: LIPASE, AMYLASE,  in the last 168 hours No results found for this basename: AMMONIA,  in the last 168 hours CBC:  Recent Labs Lab 11/21/12 2336 11/23/12 0621 11/25/12 1532 11/25/12 1642 11/26/12 0829 11/27/12 0425  WBC 11.0* 9.4 12.6*  --  8.0 8.2  NEUTROABS 7.1 6.2 10.6*  --  5.1   --   HGB 11.0* 9.8* 9.9* 9.5* 8.7* 8.7*  HCT 33.4* 29.6* 29.3* 28.0* 26.2* 26.3*  MCV 81.7 81.3 80.5  --  81.9 81.2  PLT 199 214 261  --  206 232   Cardiac Enzymes: No results found for this basename: CKTOTAL, CKMB, CKMBINDEX, TROPONINI,  in the last 168 hours BNP (last 3 results) No results found for this basename: PROBNP,  in the last 8760 hours CBG:  Recent Labs Lab 11/26/12 1244 11/26/12 1624 11/26/12 2201 11/27/12 0736 11/27/12 1213  GLUCAP 148* 187* 120* 143* 144*    Recent Results (from the past 240 hour(s))  URINE CULTURE     Status: None   Collection Time    11/21/12 10:28 PM      Result Value Range Status   Specimen Description URINE, CATHETERIZED   Final   Special Requests NONE   Final   Culture  Setup Time 11/22/2012 18:48   Final   Colony Count NO GROWTH   Final   Culture NO GROWTH   Final   Report Status 11/23/2012 FINAL   Final  CULTURE, BLOOD (ROUTINE X 2)     Status: None   Collection Time    11/21/12 11:36 PM      Result Value Range Status   Specimen Description BLOOD LEFT WRIST   Final   Special Requests BOTTLES DRAWN AEROBIC AND ANAEROBIC   Final   Culture  Setup Time 11/22/2012 03:49   Final   Culture     Final   Value:        BLOOD CULTURE RECEIVED NO GROWTH TO DATE CULTURE WILL BE HELD FOR 5 DAYS BEFORE ISSUING A FINAL NEGATIVE REPORT   Report Status PENDING   Incomplete  CULTURE, BLOOD (ROUTINE X 2)     Status: None   Collection Time    11/21/12 11:43 PM      Result Value Range Status   Specimen Description BLOOD RIGHT ARM   Final   Special Requests BOTTLES DRAWN AEROBIC AND ANAEROBIC 5CC   Final   Culture  Setup Time 11/22/2012 03:49   Final   Culture     Final   Value:        BLOOD CULTURE RECEIVED NO GROWTH TO DATE CULTURE WILL BE HELD FOR 5 DAYS BEFORE ISSUING A FINAL NEGATIVE REPORT   Report Status PENDING   Incomplete  MRSA PCR SCREENING     Status: None   Collection Time    11/22/12  6:40 AM      Result Value Range Status    MRSA by PCR NEGATIVE  NEGATIVE Final   Comment:            The GeneXpert MRSA Assay (FDA     approved for NASAL specimens     only), is one component of a     comprehensive MRSA colonization     surveillance program. It is not  intended to diagnose MRSA     infection nor to guide or     monitor treatment for     MRSA infections.  CULTURE, BLOOD (ROUTINE X 2)     Status: None   Collection Time    11/25/12  3:33 PM      Result Value Range Status   Specimen Description BLOOD BLOOD RIGHT FOREARM   Final   Special Requests BOTTLES DRAWN AEROBIC AND ANAEROBIC 10CC   Final   Culture  Setup Time 11/26/2012 00:26   Final   Culture     Final   Value:        BLOOD CULTURE RECEIVED NO GROWTH TO DATE CULTURE WILL BE HELD FOR 5 DAYS BEFORE ISSUING A FINAL NEGATIVE REPORT   Report Status PENDING   Incomplete  CULTURE, BLOOD (ROUTINE X 2)     Status: None   Collection Time    11/25/12  3:39 PM      Result Value Range Status   Specimen Description BLOOD HAND RIGHT   Final   Special Requests BOTTLES DRAWN AEROBIC AND ANAEROBIC 10CC   Final   Culture  Setup Time 11/26/2012 00:28   Final   Culture     Final   Value:        BLOOD CULTURE RECEIVED NO GROWTH TO DATE CULTURE WILL BE HELD FOR 5 DAYS BEFORE ISSUING A FINAL NEGATIVE REPORT   Report Status PENDING   Incomplete  URINE CULTURE     Status: None   Collection Time    11/25/12  4:07 PM      Result Value Range Status   Specimen Description URINE, RANDOM   Final   Special Requests NONE   Final   Culture  Setup Time 11/25/2012 17:32   Final   Colony Count NO GROWTH   Final   Culture NO GROWTH   Final   Report Status 11/26/2012 FINAL   Final     Studies:  Recent x-ray studies have been reviewed in detail by the Attending Physician  Scheduled Meds:  Scheduled Meds: . donepezil  10 mg Oral QHS  . insulin aspart  0-9 Units Subcutaneous TID WC  . insulin glargine  8 Units Subcutaneous QHS  . levothyroxine  100 mcg Oral q morning - 10a   . memantine  10 mg Oral BID  . metoprolol tartrate  25 mg Oral BID  . pantoprazole  40 mg Oral Daily  . piperacillin-tazobactam (ZOSYN)  IV  3.375 g Intravenous Q8H  . Rivaroxaban  15 mg Oral Q lunch  . sertraline  25 mg Oral q morning - 10a  . simvastatin  10 mg Oral q1800  . sodium chloride  3 mL Intravenous Q12H   Continuous Infusions:    Time spent on care of this patient:   Calvert Cantor, MD [phone]  [facility] Office  [phone] Pager - Text Page per Amion as per below:  On-Call/Text Page:      [email or web address]      password [email or web address]  If 7PM-7AM, please contact night-coverage [email or web address] Password [email or web address] 11/27/2012, 4:26 PM   LOS: 2 days

## 2012-11-27 NOTE — Progress Notes (Signed)
ANTIBIOTIC CONSULT NOTE - INITIAL  Pharmacy Consult for vancomycin/zosyn Indication: sepsis  Allergies  Allergen Reactions  . Hydrocodone-Homatropine     Unknown  . Latex Rash    blisters    Patient Measurements: Height: 5' 1.02" (155 cm) Weight: 139 lb 12.4 oz (63.4 kg) IBW/kg (Calculated) : 47.86  Vital Signs: Temp: 98.5 F (36.9 C) (05/11 0400) Temp src: Oral (05/10 2326) BP: 121/58 mmHg (05/11 0400) Pulse Rate: 81 (05/11 0400) Intake/Output from previous day: 05/10 0701 - 05/11 0700 In: 2370.3 [P.O.:120; I.V.:1950.3; IV Piggyback:300] Out: -  Intake/Output from this shift:    Labs:  Recent Labs  11/25/12 1532 11/25/12 1642 11/26/12 0829 11/27/12 0425  WBC 12.6*  --  8.0 8.2  HGB 9.9* 9.5* 8.7* 8.7*  PLT 261  --  206 232  CREATININE 1.19* 1.00 0.88  --    Estimated Creatinine Clearance: 47.2 ml/min (by C-G formula based on Cr of 0.88). No results found for this basename: VANCOTROUGH, Leodis Binet, VANCORANDOM, GENTTROUGH, GENTPEAK, GENTRANDOM, TOBRATROUGH, TOBRAPEAK, TOBRARND, AMIKACINPEAK, AMIKACINTROU, AMIKACIN,  in the last 72 hours   Microbiology: Recent Results (from the past 720 hour(s))  URINE CULTURE     Status: None   Collection Time    11/21/12 10:28 PM      Result Value Range Status   Specimen Description URINE, CATHETERIZED   Final   Special Requests NONE   Final   Culture  Setup Time 11/22/2012 18:48   Final   Colony Count NO GROWTH   Final   Culture NO GROWTH   Final   Report Status 11/23/2012 FINAL   Final  CULTURE, BLOOD (ROUTINE X 2)     Status: None   Collection Time    11/21/12 11:36 PM      Result Value Range Status   Specimen Description BLOOD LEFT WRIST   Final   Special Requests BOTTLES DRAWN AEROBIC AND ANAEROBIC   Final   Culture  Setup Time 11/22/2012 03:49   Final   Culture     Final   Value:        BLOOD CULTURE RECEIVED NO GROWTH TO DATE CULTURE WILL BE HELD FOR 5 DAYS BEFORE ISSUING A FINAL NEGATIVE REPORT   Report  Status PENDING   Incomplete  CULTURE, BLOOD (ROUTINE X 2)     Status: None   Collection Time    11/21/12 11:43 PM      Result Value Range Status   Specimen Description BLOOD RIGHT ARM   Final   Special Requests BOTTLES DRAWN AEROBIC AND ANAEROBIC 5CC   Final   Culture  Setup Time 11/22/2012 03:49   Final   Culture     Final   Value:        BLOOD CULTURE RECEIVED NO GROWTH TO DATE CULTURE WILL BE HELD FOR 5 DAYS BEFORE ISSUING A FINAL NEGATIVE REPORT   Report Status PENDING   Incomplete  MRSA PCR SCREENING     Status: None   Collection Time    11/22/12  6:40 AM      Result Value Range Status   MRSA by PCR NEGATIVE  NEGATIVE Final   Comment:            The GeneXpert MRSA Assay (FDA     approved for NASAL specimens     only), is one component of a     comprehensive MRSA colonization     surveillance program. It is not     intended to diagnose  MRSA     infection nor to guide or     monitor treatment for     MRSA infections.  URINE CULTURE     Status: None   Collection Time    11/25/12  4:07 PM      Result Value Range Status   Specimen Description URINE, RANDOM   Final   Special Requests NONE   Final   Culture  Setup Time 11/25/2012 17:32   Final   Colony Count NO GROWTH   Final   Culture NO GROWTH   Final   Report Status 11/26/2012 FINAL   Final    Medical History: Past Medical History  Diagnosis Date  . Diabetes mellitus without complication   . Hypertension   . Dementia   . Hypothyroidism   . Alzheimer disease     Assessment: 78 year old female to continue on vancomycin and zosyn for sepsis and left hand cellulitis. Fever 102.7 in ED,  now afebrile, WBC 8.2 trend down.  Urine culture no growth, blood cultures pending.  Scr 0.88 stable est crcl 47 ml/min  Goal of Therapy:  Vancomycin trough level 15-20 mcg/ml  Plan:  1. Continue Zosyn 3.375g IV  q8h 2. Adjust vancomycin to 500 mg IV q12h based on renal function 3. F/u vanc trough at steady state, clinical  progression and cultures  Bola A. Wandra Feinstein D Clinical Pharmacist Pager:[phone] Phone [phone] 11/27/2012 8:12 AM

## 2012-11-28 LAB — BASIC METABOLIC PANEL
CO2: 24 mEq/L (ref 19–32)
Chloride: 104 mEq/L (ref 96–112)
Glucose, Bld: 166 mg/dL — ABNORMAL HIGH (ref 70–99)
Potassium: 3.4 mEq/L — ABNORMAL LOW (ref 3.5–5.1)
Sodium: 139 mEq/L (ref 135–145)

## 2012-11-28 LAB — CULTURE, BLOOD (ROUTINE X 2): Culture: NO GROWTH

## 2012-11-28 LAB — GLUCOSE, CAPILLARY
Glucose-Capillary: 175 mg/dL — ABNORMAL HIGH (ref 70–99)
Glucose-Capillary: 159 mg/dL — ABNORMAL HIGH (ref 70–99)

## 2012-11-28 NOTE — Progress Notes (Signed)
   CARE MANAGEMENT NOTE 11/28/2012  Patient:  Sarah Yang, Sarah Yang   Account Number:  [id number]  Date Initiated:  11/28/2012  Documentation initiated by:  [facility]  Subjective/Objective Assessment:   acute encephalopathy possibly secondary to UTI     Action/Plan:   lives at [facility]   Anticipated DC Date:  11/30/2012   Anticipated DC Plan:  HOME W HOME HEALTH SERVICES      DC Planning Services  CM consult      Choice offered to / List presented to:             Status of service:  In process, will continue to follow Medicare Important Message given?   (If response is "NO", the following Medicare IM given date fields will be blank) Date Medicare IM given:   Date Additional Medicare IM given:    Discharge Disposition:    Per UR Regulation:    If discussed at Long Length of Stay Meetings, dates discussed:    Comments:  11/28/2012 1400 NCM spoke to [place] rep and pt active with Genevieve Norlander for [facility] PT. Will need resumption of care orders at dc. Isidoro Donning RN CCM Case Mgmt phone [phone]

## 2012-11-28 NOTE — Progress Notes (Signed)
ANTIBIOTIC CONSULT NOTE - Follow-Up  Pharmacy Consult for Zosyn Indication: L hand cellulitis  Allergies  Allergen Reactions  . Hydrocodone-Homatropine     Unknown  . Latex Rash    blisters    Patient Measurements: Height: 5\' 1"  (154.9 cm) Weight: 132 lb 1.6 oz (59.92 kg) IBW/kg (Calculated) : 47.8  Vital Signs: Temp: 98 F (36.7 C) (05/12 0600) Temp src: Oral (05/12 0600) BP: 164/87 mmHg (05/12 0600) Pulse Rate: 85 (05/12 0600) Intake/Output from previous day: 05/11 0701 - 05/12 0700 In: 1065 [P.O.:960; I.V.:55; IV Piggyback:50] Out: -  Intake/Output from this shift:    Labs:  Recent Labs  11/25/12 1532 11/25/12 1642 11/26/12 0829 11/27/12 0425 11/28/12 0832  WBC 12.6*  --  8.0 8.2  --   HGB 9.9* 9.5* 8.7* 8.7*  --   PLT 261  --  206 232  --   CREATININE 1.19* 1.00 0.88  --  0.98   Estimated Creatinine Clearance: 41.2 ml/min (by C-G formula based on Cr of 0.98). No results found for this basename: VANCOTROUGH, Leodis Binet, VANCORANDOM, GENTTROUGH, GENTPEAK, GENTRANDOM, TOBRATROUGH, TOBRAPEAK, TOBRARND, AMIKACINPEAK, AMIKACINTROU, AMIKACIN,  in the last 72 hours   Microbiology: Recent Results (from the past 720 hour(s))  URINE CULTURE     Status: None   Collection Time    11/21/12 10:28 PM      Result Value Range Status   Specimen Description URINE, CATHETERIZED   Final   Special Requests NONE   Final   Culture  Setup Time 11/22/2012 18:48   Final   Colony Count NO GROWTH   Final   Culture NO GROWTH   Final   Report Status 11/23/2012 FINAL   Final  CULTURE, BLOOD (ROUTINE X 2)     Status: None   Collection Time    11/21/12 11:36 PM      Result Value Range Status   Specimen Description BLOOD LEFT WRIST   Final   Special Requests BOTTLES DRAWN AEROBIC AND ANAEROBIC   Final   Culture  Setup Time 11/22/2012 03:49   Final   Culture NO GROWTH 5 DAYS   Final   Report Status 11/28/2012 FINAL   Final  CULTURE, BLOOD (ROUTINE X 2)     Status: None   Collection Time    11/21/12 11:43 PM      Result Value Range Status   Specimen Description BLOOD RIGHT ARM   Final   Special Requests BOTTLES DRAWN AEROBIC AND ANAEROBIC 5CC   Final   Culture  Setup Time 11/22/2012 03:49   Final   Culture NO GROWTH 5 DAYS   Final   Report Status 11/28/2012 FINAL   Final  MRSA PCR SCREENING     Status: None   Collection Time    11/22/12  6:40 AM      Result Value Range Status   MRSA by PCR NEGATIVE  NEGATIVE Final   Comment:            The GeneXpert MRSA Assay (FDA     approved for NASAL specimens     only), is one component of a     comprehensive MRSA colonization     surveillance program. It is not     intended to diagnose MRSA     infection nor to guide or     monitor treatment for     MRSA infections.  CULTURE, BLOOD (ROUTINE X 2)     Status: None   Collection Time  11/25/12  3:33 PM      Result Value Range Status   Specimen Description BLOOD BLOOD RIGHT FOREARM   Final   Special Requests BOTTLES DRAWN AEROBIC AND ANAEROBIC 10CC   Final   Culture  Setup Time 11/26/2012 00:26   Final   Culture     Final   Value:        BLOOD CULTURE RECEIVED NO GROWTH TO DATE CULTURE WILL BE HELD FOR 5 DAYS BEFORE ISSUING A FINAL NEGATIVE REPORT   Report Status PENDING   Incomplete  CULTURE, BLOOD (ROUTINE X 2)     Status: None   Collection Time    11/25/12  3:39 PM      Result Value Range Status   Specimen Description BLOOD HAND RIGHT   Final   Special Requests BOTTLES DRAWN AEROBIC AND ANAEROBIC 10CC   Final   Culture  Setup Time 11/26/2012 00:28   Final   Culture     Final   Value:        BLOOD CULTURE RECEIVED NO GROWTH TO DATE CULTURE WILL BE HELD FOR 5 DAYS BEFORE ISSUING A FINAL NEGATIVE REPORT   Report Status PENDING   Incomplete  URINE CULTURE     Status: None   Collection Time    11/25/12  4:07 PM      Result Value Range Status   Specimen Description URINE, RANDOM   Final   Special Requests NONE   Final   Culture  Setup Time 11/25/2012  17:32   Final   Colony Count NO GROWTH   Final   Culture NO GROWTH   Final   Report Status 11/26/2012 FINAL   Final    Medical History: Past Medical History  Diagnosis Date  . Diabetes mellitus without complication   . Hypertension   . Dementia   . Hypothyroidism   . Alzheimer disease     Medications:  Pending  Assessment: 78 year old female on Zosyn (Day #4) for sepsis, presumed source L hand cellulitis. Recently d/c'ed from [facility] on 5/7(received ceftriaxone-d/c'ed on cipro). Afeb. WBC back to nl.  vanc 5/9>>5/11 Zosyn 5/9>>  Cx 5/9 urine>> neg 5/9 blood>>ngtd  Goal of Therapy:  Eradication of infection  Plan:  1. Continue Zosyn 3.375 IV q8h 2. Will continue to follow renal function, cx, and LOT  Christoper Fabian, PharmD, BCPS Clinical pharmacist, pager [phone] 11/28/2012 3:03 PM

## 2012-11-28 NOTE — Evaluation (Signed)
Physical Therapy Evaluation Patient Details Name: Sarah Yang MRN: [id number] DOB: Apr 23, 1935 Today's Date: 11/28/2012 Time: 4098-1191 PT Time Calculation (min): 27 min  PT Assessment / Plan / Recommendation Clinical Impression    Pt admitted with sepsis (from ? Left hand cellulitis) with h/o dementia. Pt has resided at ALF since January 2014 (per last admission SW note). Unclear pt's baseline functional status, however currently requires +2 to safely transfer. Pt currently with functional limitations due to the deficits listed below (PT Problem List). Pt will benefit from skilled PT to  increase their independence and safety with mobility to decr caregiver burden and level of care required on d/c.      PT Assessment  Patient needs continued PT services    Follow Up Recommendations  Home health PT  (at ALF if they can provide care); Supervision/Assistance - 24 hour    Does the patient have the potential to tolerate intense rehabilitation      Barriers to Discharge None      Equipment Recommendations  None recommended by PT    Recommendations for Other Services     Frequency Min 2X/week    Precautions / Restrictions Precautions Precautions: Fall;Other (comment) (Lt heel ulcer)   Pertinent Vitals/Pain Reported she aches/hurts all over (? Due to fever); RN informed pt asking for pain medicine      Mobility  Bed Mobility Bed Mobility: Rolling Right;Right Sidelying to Sit;Sitting - Scoot to [place] of Bed;Sit to Sidelying Right;Scooting to [facility] Rolling Right: 3: Mod assist;With rail Right Sidelying to Sit: 3: Mod assist;With rails;HOB elevated Sitting - Scoot to Edge of Bed: 2: Max assist Sit to Sidelying Right: 2: Max assist;HOB flat Scooting to HOB: 1: +2 Total assist Scooting to [facility]: Patient Percentage: 0% Details for Bed Mobility Assistance: pt requires hand over hand guidance to find rail and to assist with movements; stiff/rigid (?due to  inactivity) Transfers Transfers: Sit to Stand;Stand to Sit Sit to Stand: 3: Mod assist;With upper extremity assist Stand to Sit: 3: Mod assist;With upper extremity assist Details for Transfer Assistance: x2 from EOB to RW, however pt uable to grasp with Lt hand due to pain; posterior lean and bil hip and knee flexion; unable to pivot safely with +1 assist    Exercises General Exercises - Lower Extremity Ankle Circles/Pumps: AROM;Both;10 reps;Supine Heel Slides: AAROM;Both;5 reps;Supine (assisted flexion; resisted extension)   PT Diagnosis: Generalized weakness  PT Problem List: Decreased strength;Decreased range of motion;Decreased activity tolerance;Decreased balance;Decreased mobility;Decreased cognition;Decreased coordination;Decreased knowledge of use of DME;Pain PT Treatment Interventions: DME instruction;Gait training;Functional mobility training;Therapeutic activities;Therapeutic exercise;Balance training;Cognitive remediation;Patient/family education   PT Goals Acute Rehab PT Goals PT Goal Formulation: Patient unable to participate in goal setting Time For Goal Achievement: 12/12/12 Potential to Achieve Goals: Good Pt will Roll Supine to Right Side: with supervision;with rail PT Goal: Rolling Supine to Right Side - Progress: Goal set today Pt will go Supine/Side to Sit: with min assist;with HOB 0 degrees;with rail PT Goal: Supine/Side to Sit - Progress: Goal set today Pt will Sit at Edge of Bed: with supervision;3-5 min;with no upper extremity support PT Goal: Sit at Edge Of Bed - Progress: Goal set today Pt will go Sit to Stand: with min assist PT Goal: Sit to Stand - Progress: Goal set today Pt will Transfer Bed to Chair/Chair to Bed: with min assist PT Transfer Goal: Bed to Chair/Chair to Bed - Progress: Goal set today  Visit Information  Last PT Received On: 11/28/12 Assistance Needed: +2  Subjective Data  Subjective: I just don't have any strength Patient Stated  Goal: unable to state due to decr cognition   Prior Functioning  Home Living Lives With: Other (Comment) (from ALF (per recent d/c records)) Available Help at Discharge: Personal care attendant;Available 24 hours/day;Other (Comment) (at ALF) Type of Home: Assisted living Prior Function Comments: unknown; presence of heel ulcer would suggest her mobility has been limited Communication Communication: No difficulties    Cognition  Cognition Arousal/Alertness: Awake/alert Behavior During Therapy: WFL for tasks assessed/performed Overall Cognitive Status: No family/caregiver present to determine baseline cognitive functioning (h/o dementia; ? baseline)    Extremity/Trunk Assessment Right Lower Extremity Assessment RLE ROM/Strength/Tone: Deficits RLE ROM/Strength/Tone Deficits: AAROM hip, knee, ankle WFL; strength 3+-4 RLE Coordination: Deficits RLE Coordination Deficits: bradykinesia Left Lower Extremity Assessment LLE ROM/Strength/Tone: Deficits LLE ROM/Strength/Tone Deficits: AAROM WFL hip and ankle; Knee flexion to 100 degrees; grossly 3 to 3+ strength LLE Coordination: Deficits LLE Coordination Deficits: bradykinesia Trunk Assessment Trunk Assessment: Kyphotic   Balance Balance Balance Assessed: Yes Static Sitting Balance Static Sitting - Balance Support: Bilateral upper extremity supported;Feet supported Static Sitting - Level of Assistance: 4: Min assist Static Sitting - Comment/# of Minutes: EOB total of 7 minutes Static Standing Balance Static Standing - Balance Support: Right upper extremity supported Static Standing - Level of Assistance: 3: Mod assist Static Standing - Comment/# of Minutes: posterior lean; flexed posture; stood up to 60 seconds  End of Session PT - End of Session Equipment Utilized During Treatment: Gait belt Activity Tolerance: Patient limited by fatigue Patient left: in bed;with call bell/phone within reach;with bed alarm set Nurse Communication:  Mobility status  GP     Sarah Yang 11/28/2012, 11:04 AM Pager [phone]

## 2012-11-28 NOTE — Consult Note (Signed)
WOC consult Note Reason for Consult: Left heel wound Wound type: Unstageable pressure ulcer Pressure Ulcer POA: Yes Measurement: 7cm X 2xm  Wound bed: Hard, black eschar Drainage (amount, consistency, odor): No odor, no drainage Periwound: Intact Dressing procedure/placement/frequency: It is best practice to leave dry stable eschar to heels intact. Area wiped with skin prep to provide protection to area, foam dressing also placed to promote healing, may remain in place for 5 days or change if needed.  Heel is elevated on pillow to prevent further pressure. Norva Karvonen RN, MSN Student Please re-consult if further assistance is needed.  Thank-you,  Cammie Mcgee MSN, RN, CWOCN, [place], CNS [phone]

## 2012-11-28 NOTE — Evaluation (Signed)
Occupational Therapy Evaluation Patient Details Name: Sarah Yang MRN: [id number] DOB: 08-27-1934 Today's Date: 11/28/2012 Time: 4098-1191 OT Time Calculation (min): 32 min  OT Assessment / Plan / Recommendation Clinical Impression  78 yo female admitted with acute encephalopathy due to UTI that could benefit from skilled OT acutely. Recommend SNF for d/c planning unless ALF can provided total care level.     OT Assessment  Patient needs continued OT Services    Follow Up Recommendations  SNF    Barriers to Discharge      Equipment Recommendations  3 in 1 bedside comode;Wheelchair (measurements OT);Wheelchair cushion (measurements OT)    Recommendations for Other Services    Frequency  Min 1X/week    Precautions / Restrictions Precautions Precautions: Fall;Other (comment) (Ht heel ulcer)   Pertinent Vitals/Pain Pain at buttock Barrier cream applied    ADL  Grooming: Teeth care;Moderate assistance (cleaning dentures) Where Assessed - Grooming: Supported sitting Toilet Transfer: Maximal assistance Toilet Transfer Method: Sit to Barista: Raised toilet seat with arms (or 3-in-1 over toilet) Toileting - Clothing Manipulation and Hygiene: +1 Total assistance Where Assessed - Toileting Clothing Manipulation and Hygiene: Sit to stand from 3-in-1 or toilet Transfers/Ambulation Related to ADLs: Pt attempts to sit prematurely with transfers. pt does better if not allowed to pivot and turn until in a safe position to desecend to surface.  ADL Comments: Pt voiding bowel in bed and RN staff just finished hygiene supine in bed. Pt completed bed mobility with cueing to EOB. pt static standing with posterior lean. pt provided chair for BIL UE support and static standing +1 person assistance. pt easily distracted and with LOB when turning head. Pt ambulated ~3 feet and needing to void bowels again. Pt positioned on 3n1. Pt with void bowel and bladder. Pt provided  barrier cream due to redness at buttock.    OT Diagnosis: Generalized weakness  OT Problem List: Decreased strength;Decreased activity tolerance;Impaired balance (sitting and/or standing);Decreased cognition;Decreased safety awareness;Decreased knowledge of use of DME or AE;Decreased knowledge of precautions OT Treatment Interventions: Self-care/ADL training;Therapeutic exercise;DME and/or AE instruction;Therapeutic activities;Patient/family education;Balance training   OT Goals Acute Rehab OT Goals OT Goal Formulation: Patient unable to participate in goal setting Time For Goal Achievement: 12/12/12 Potential to Achieve Goals: Good ADL Goals Pt Will Perform Eating: with set-up;Supported;Sitting, chair ADL Goal: Eating - Progress: Goal set today Pt Will Perform Grooming: with set-up;Sitting, chair;Supported;with cueing (comment type and amount) ADL Goal: Grooming - Progress: Goal set today Pt Will Perform Upper Body Bathing: with set-up;Sitting, chair;Supported ADL Goal: Product manager - Progress: Goal set today Pt Will Transfer to Toilet: with min assist;Ambulation;3-in-1 ADL Goal: Toilet Transfer - Progress: Goal set today  Visit Information  Last OT Received On: 11/28/12 Assistance Needed: +2    Subjective Data  Subjective: "We are cleaning my dentures" Patient Stated Goal: unable to verbalize   Prior Functioning     Home Living Lives With:  (from ALF ([facility]) Available Help at Discharge: Personal care attendant;Available 24 hours/day;Other (Comment) Type of Home: Assisted living Additional Comments: Pt able to wash face and completed UB care with cueing from staff. Pt needs total (A) for LB care at baseline. pt with progressive decline in ambulation. Pt uses RW at baseline. Pt completes bed mobility Independently at ALF. Pts daughter works at ALF Prior Function Level of Independence: Needs assistance Needs Assistance:  Dressing;Feeding;Bathing;Toileting Bath: Moderate Dressing: Moderate Feeding: Minimal Toileting: Moderate Able to Take Stairs?: No Driving:  No Vocation: Retired Musician: No difficulties Dominant Hand: Right         Vision/Perception Vision - History Baseline Vision: No visual deficits Patient Visual Report: No change from baseline   Cognition  Cognition Arousal/Alertness: Awake/alert Behavior During Therapy: WFL for tasks assessed/performed Overall Cognitive Status: History of cognitive impairments - at baseline    Extremity/Trunk Assessment Right Upper Extremity Assessment RUE ROM/Strength/Tone: WFL for tasks assessed;Due to impaired cognition RUE Sensation: WFL - Light Touch RUE Coordination: WFL - gross/fine motor Left Upper Extremity Assessment LUE ROM/Strength/Tone: Within functional levels;Due to impaired cognition LUE Sensation: WFL - Light Touch LUE Coordination: WFL - gross/fine motor Trunk Assessment Trunk Assessment: Kyphotic     Mobility Bed Mobility Supine to Sit: 4: Min assist;HOB flat (pt pulling against therapist arm but therapist not helping) Sitting - Scoot to Edge of Bed: 4: Min assist Sit to Supine: 4: Min assist;HOB flat (cue for placing head on pillow) Details for Bed Mobility Assistance: pt initially attempting to return to supine by posterior lean. Pt then cued to lay down on right elbow and pt able to bring BIL LE into bed.  Transfers Transfers: Sit to Stand;Stand to Sit Sit to Stand: With upper extremity assist;From bed;3: Mod assist Stand to Sit: 3: Mod assist;With upper extremity assist;To bed Details for Transfer Assistance: pt sit <>stand x4 during session with progressive fatigue with transfers     Exercise     Balance Static Sitting Balance Static Sitting - Balance Support: Bilateral upper extremity supported;Feet supported Static Sitting - Level of Assistance: 4: Min assist (posterior lean with  sitting) Static Sitting - Comment/# of Minutes: ~3 minutes Static Standing Balance Static Standing - Balance Support: Bilateral upper extremity supported;During functional activity Static Standing - Level of Assistance: 3: Mod assist Static Standing - Comment/# of Minutes: posterior lean and cues for pt to stand tall   End of Session OT - End of Session Activity Tolerance: Patient tolerated treatment well Patient left: in chair;with call bell/phone within reach;with bed alarm set Nurse Communication: Mobility status;Precautions  GO     Lucile Shutters 11/28/2012, 4:31 PM Pager: [phone]

## 2012-11-28 NOTE — Progress Notes (Signed)
Clinical Social Work Department BRIEF PSYCHOSOCIAL ASSESSMENT 11/28/2012  Patient:  Sarah Yang, Sarah Yang     Account Number:  [id number]     Admit date:  11/25/2012  Clinical Social Worker:  Hattie Perch  Date/Time:  11/28/2012 12:00 M  Referred by:  Physician  Date Referred:  11/28/2012 Referred for  ALF Placement   Other Referral:   Interview type:  Family Other interview type:    PSYCHOSOCIAL DATA Living Status:  FACILITY Admitted from facility:  [facility] Level of care:  Assisted Living Primary support name:  theresa Primary support relationship to patient:  CHILD, ADULT Degree of support available:   good    CURRENT CONCERNS Current Concerns  Post-Acute Placement   Other Concerns:    SOCIAL WORK ASSESSMENT / PLAN Patient is very confused.CSW called and spoke to patients daughter, Aggie Cosier [phone], she confirms that patient is a resident of [facility] memory care unit and will return there upon discharge.   Assessment/plan status:   Other assessment/ plan:   Information/referral to community resources:    PATIENT'S/FAMILY'S RESPONSE TO PLAN OF CARE: agreeable to return to [facility] upon discharge.

## 2012-11-28 NOTE — Progress Notes (Signed)
[facility] Progress Note [place] TEAM 1 - Stepdown/ICU TEAM   Hannah Strader WGN:[id number] DOB: [birth year] DOA: 11/25/2012 PCP: Pcp Not In System  Brief narrative: Sarah Yang is a 78 y.o. female who was just discharged 2 days ago after being admitted for acute encephalopathy possibly secondary to UTI was brought to the ER after patient was found to be febrile and confused. In the ER patient was initially found to be mildly hypotensive with elevated lactic acid levels. Patient was given fluid bolus after which patient's blood pressure improved and lactic acid levels also improved. Patient was also found to be febrile. Blood cultures and urine cultures were sent. UA and chest x-ray were unremarkable. Patient has been admitted for further management. As per patient's daughter patient has been having swelling and pain in the left hand for last one week. Patient also has heel skin changes which has developed over the last 2 weeks. Patient also has left knee swelling which has been there for last few weeks. Patient otherwise did not have any nausea vomiting abdominal pain diarrhea chest pain or shortness of breath.   Assessment/Plan: Principal Problem:   Sepsis/left hand cellulitis - source likely is left hand cellulitis - cultures negative - cont Zosyn - doppler left arm negative for DVT  Active Problems:   Encephalopathy acute  -likely due to sepsis and delirium (underlying dementia)  Swelling left knee -chronic and likely osteoarthritis    Dementia    DM2 (diabetes mellitus, type 2) - cont current dose of lantus and novolog    Hypertension - cont metoprolol    Hypothyroidism - cont levothyroxine    Atrial fibrillation/  Cardiac pacemaker - currently NSR    Code Status: full code Family Communication: Updated patient no family at bedside. Disposition Plan: Home when medically stable.  Consultants: none  Procedures: none  Antibiotics: Zosyn-  5/9>> Vancomycin 5/9>>5/11  DVT prophylaxis: Heparin  HPI/Subjective: Pt alert and has no complaints.   Objective: Blood pressure 164/87, pulse 85, temperature 98 F (36.7 C), temperature source Oral, resp. rate 18, height 5\' 1"  (1.549 m), weight 59.92 kg (132 lb 1.6 oz), SpO2 100.00%.  Intake/Output Summary (Last 24 hours) at 11/28/12 1248 Last data filed at 11/27/12 1700  Gross per 24 hour  Intake    290 ml  Output      0 ml  Net    290 ml     Exam: General: AA and mildly disoriented, No acute respiratory distress Lungs: Clear to auscultation bilaterally without wheezes or crackles Cardiovascular: Regular rate and rhythm without murmur gallop or rub normal S1 and S2 Abdomen: Nontender, nondistended, soft, bowel sounds positive, no rebound, no ascites, no appreciable mass Extremities: No significant cyanosis, clubbing, or edema bilateral lower extremities, able to flex and extend at left knee- mild swelling present in knee.   Data Reviewed: Basic Metabolic Panel:  Recent Labs Lab 11/21/12 2336 11/22/12 0505 11/25/12 1532 11/25/12 1642 11/26/12 0829 11/28/12 0832  NA 134* 138 135 136 138 139  K 3.7 3.8 4.1 3.9 3.7 3.4*  CL 100 106 101 104 107 104  CO2 23 23 19   --  21 24  GLUCOSE 197* 170* 259* 249* 132* 166*  BUN 14 13 18 18 11 7   CREATININE 1.06 0.94 1.19* 1.00 0.88 0.98  CALCIUM 9.3 9.3 8.6  --  8.9 9.5   Liver Function Tests:  Recent Labs Lab 11/21/12 2336 11/22/12 0505 11/25/12 1532 11/26/12 0829  AST 11 11 12  13  ALT 6 7 11 8   ALKPHOS 61 56 77 64  BILITOT 0.4 0.4 0.3 0.4  PROT 6.5 6.0 6.3 5.6*  ALBUMIN 2.8* 2.6* 2.9* 2.3*   No results found for this basename: LIPASE, AMYLASE,  in the last 168 hours No results found for this basename: AMMONIA,  in the last 168 hours CBC:  Recent Labs Lab 11/21/12 2336 11/23/12 0621 11/25/12 1532 11/25/12 1642 11/26/12 0829 11/27/12 0425  WBC 11.0* 9.4 12.6*  --  8.0 8.2  NEUTROABS 7.1 6.2 10.6*  --   5.1  --   HGB 11.0* 9.8* 9.9* 9.5* 8.7* 8.7*  HCT 33.4* 29.6* 29.3* 28.0* 26.2* 26.3*  MCV 81.7 81.3 80.5  --  81.9 81.2  PLT 199 214 261  --  206 232   Cardiac Enzymes: No results found for this basename: CKTOTAL, CKMB, CKMBINDEX, TROPONINI,  in the last 168 hours BNP (last 3 results) No results found for this basename: PROBNP,  in the last 8760 hours CBG:  Recent Labs Lab 11/27/12 1213 11/27/12 1559 11/27/12 2212 11/28/12 0639 11/28/12 1139  GLUCAP 144* 125* 173* 152* 175*    Recent Results (from the past 240 hour(s))  URINE CULTURE     Status: None   Collection Time    11/21/12 10:28 PM      Result Value Range Status   Specimen Description URINE, CATHETERIZED   Final   Special Requests NONE   Final   Culture  Setup Time 11/22/2012 18:48   Final   Colony Count NO GROWTH   Final   Culture NO GROWTH   Final   Report Status 11/23/2012 FINAL   Final  CULTURE, BLOOD (ROUTINE X 2)     Status: None   Collection Time    11/21/12 11:36 PM      Result Value Range Status   Specimen Description BLOOD LEFT WRIST   Final   Special Requests BOTTLES DRAWN AEROBIC AND ANAEROBIC   Final   Culture  Setup Time 11/22/2012 03:49   Final   Culture NO GROWTH 5 DAYS   Final   Report Status 11/28/2012 FINAL   Final  CULTURE, BLOOD (ROUTINE X 2)     Status: None   Collection Time    11/21/12 11:43 PM      Result Value Range Status   Specimen Description BLOOD RIGHT ARM   Final   Special Requests BOTTLES DRAWN AEROBIC AND ANAEROBIC 5CC   Final   Culture  Setup Time 11/22/2012 03:49   Final   Culture NO GROWTH 5 DAYS   Final   Report Status 11/28/2012 FINAL   Final  MRSA PCR SCREENING     Status: None   Collection Time    11/22/12  6:40 AM      Result Value Range Status   MRSA by PCR NEGATIVE  NEGATIVE Final   Comment:            The GeneXpert MRSA Assay (FDA     approved for NASAL specimens     only), is one component of a     comprehensive MRSA colonization     surveillance  program. It is not     intended to diagnose MRSA     infection nor to guide or     monitor treatment for     MRSA infections.  CULTURE, BLOOD (ROUTINE X 2)     Status: None   Collection Time    11/25/12  3:33  PM      Result Value Range Status   Specimen Description BLOOD BLOOD RIGHT FOREARM   Final   Special Requests BOTTLES DRAWN AEROBIC AND ANAEROBIC 10CC   Final   Culture  Setup Time 11/26/2012 00:26   Final   Culture     Final   Value:        BLOOD CULTURE RECEIVED NO GROWTH TO DATE CULTURE WILL BE HELD FOR 5 DAYS BEFORE ISSUING A FINAL NEGATIVE REPORT   Report Status PENDING   Incomplete  CULTURE, BLOOD (ROUTINE X 2)     Status: None   Collection Time    11/25/12  3:39 PM      Result Value Range Status   Specimen Description BLOOD HAND RIGHT   Final   Special Requests BOTTLES DRAWN AEROBIC AND ANAEROBIC 10CC   Final   Culture  Setup Time 11/26/2012 00:28   Final   Culture     Final   Value:        BLOOD CULTURE RECEIVED NO GROWTH TO DATE CULTURE WILL BE HELD FOR 5 DAYS BEFORE ISSUING A FINAL NEGATIVE REPORT   Report Status PENDING   Incomplete  URINE CULTURE     Status: None   Collection Time    11/25/12  4:07 PM      Result Value Range Status   Specimen Description URINE, RANDOM   Final   Special Requests NONE   Final   Culture  Setup Time 11/25/2012 17:32   Final   Colony Count NO GROWTH   Final   Culture NO GROWTH   Final   Report Status 11/26/2012 FINAL   Final     Studies:  Recent x-ray studies have been reviewed in detail by the Attending Physician  Scheduled Meds:  Scheduled Meds: . donepezil  10 mg Oral QHS  . insulin aspart  0-9 Units Subcutaneous TID WC  . insulin glargine  8 Units Subcutaneous QHS  . levothyroxine  100 mcg Oral q morning - 10a  . memantine  10 mg Oral BID  . metoprolol tartrate  25 mg Oral BID  . pantoprazole  40 mg Oral Daily  . piperacillin-tazobactam (ZOSYN)  IV  3.375 g Intravenous Q8H  . Rivaroxaban  15 mg Oral Q lunch  .  sertraline  25 mg Oral q morning - 10a  . simvastatin  10 mg Oral q1800  . sodium chloride  3 mL Intravenous Q12H   Continuous Infusions:    Time spent on care of this patient:   Ramiro Harvest, MD [phone]  [facility] Office  [phone] Pager - Text Page per Amion as per below:  On-Call/Text Page:      [email or web address]      password [email or web address]  If 7PM-7AM, please contact night-coverage [email or web address] Password [email or web address] 11/28/2012, 12:48 PM   LOS: 3 days

## 2012-11-29 LAB — BASIC METABOLIC PANEL
BUN: 7 mg/dL (ref 6–23)
Chloride: 103 mEq/L (ref 96–112)
GFR calc Af Amer: 53 mL/min — ABNORMAL LOW (ref >90)
Potassium: 3.1 mEq/L — ABNORMAL LOW (ref 3.5–5.1)

## 2012-11-29 LAB — MAGNESIUM: Magnesium: 1.8 mg/dL (ref 1.5–2.5)

## 2012-11-29 LAB — GLUCOSE, CAPILLARY: Glucose-Capillary: 138 mg/dL — ABNORMAL HIGH (ref 70–99)

## 2012-11-29 MED ORDER — POTASSIUM CHLORIDE CRYS ER 20 MEQ PO TBCR
40.0000 meq | EXTENDED_RELEASE_TABLET | Freq: Once | ORAL | Status: AC
  Administered 2012-11-29: 40 meq via ORAL
  Filled 2012-11-29: qty 2

## 2012-11-29 MED ORDER — CEPHALEXIN 500 MG PO CAPS: 500.0000 mg | ORAL_CAPSULE | Freq: Four times a day (QID) | ORAL | Status: AC

## 2012-11-29 MED ORDER — POTASSIUM CHLORIDE CRYS ER 20 MEQ PO TBCR
40.0000 meq | EXTENDED_RELEASE_TABLET | ORAL | Status: AC
  Administered 2012-11-29: 40 meq via ORAL
  Filled 2012-11-29 (×4): qty 2

## 2012-11-29 NOTE — Progress Notes (Signed)
Patient discharge paperwork complete and IV removed.  Patient will travel by [facility] to SNF.  Lance Bosch, RN

## 2012-11-29 NOTE — Discharge Summary (Signed)
Physician Discharge Summary  Sarah Yang WUJ:[id number] DOB: 1934-10-09 DOA: 11/25/2012  PCP: Pcp Not In System  Admit date: 11/25/2012 Discharge date: 11/29/2012  Time spent: 60 minutes  Recommendations for Outpatient Follow-up:  1. Patient is to followup with M.D. at the facility one week post discharge home and followup with PCP as outpatient. Patient will need a basic metabolic profile checked to followup on electrolytes and renal function.  Discharge Diagnoses:  Principal Problem:   Sepsis Active Problems:   Dementia   DM2 (diabetes mellitus, type 2)   Hypertension   Hypothyroidism   Cardiac pacemaker   Encephalopathy acute   Atrial fibrillation   Cellulitis   Discharge Condition: Stable and improved  Diet recommendation: Carb modified diet.  Filed Weights   11/27/12 1923 11/28/12 0500 11/29/12 0500  Weight: 64.365 kg (141 lb 14.4 oz) 59.92 kg (132 lb 1.6 oz) 59.013 kg (130 lb 1.6 oz)    History of present illness:  Sarah Yang is a 78 y.o. female who was just discharged 2 days ago after being admitted for acute encephalopathy possibly secondary to UTI was brought to the ER after patient was found to be febrile and confused. In the ER patient was initially found to be mildly hypotensive with elevated lactic acid levels. Patient was given fluid bolus after which patient's blood pressure improved and lactic acid levels also improved. Patient was also found to be febrile. Blood cultures and urine cultures were sent. UA and chest x-ray were unremarkable. Patient has been admitted for further management. As per patient's daughter patient has been having swelling and pain in the left hand for last one week. Patient also has heel skin changes which has developed over the last 2 weeks. Patient also has left knee swelling which has been there for last few weeks. Patient otherwise did not have any nausea vomiting abdominal pain diarrhea chest pain or shortness of breath.       Hospital Course:  #1 sepsis/(cellulitis Patient was admitted with sepsis which was felt to be secondary to a left hand cellulitis. Patient was placed in the step down unit which was hydrated with IV fluids and monitored. Patient's blood pressure improved with IV fluids. Dopplers of the left upper extremity were obtained which were negative for DVT. Patient was empirically started on IV vancomycin and Zosyn was cultures were pending. Urine cultures were negative. Blood cultures had no growth to date. Patient improved clinically and vancomycin was subsequently discontinued. Patient was subsequently transferred to the telemetry floor where she remained in stable condition. Patient be discharged home on 4 more days of oral Keflex to complete a one-week course of antibiotic therapy. Patient will followup with M.D. at facility.  #2 acute encephalopathy On admission patient was noted to be confused which was felt to be likely secondary to sepsis in the setting of underlying dementia. Patient was treated with IV antibiotics and improved clinically and was close to baseline by day of discharge. Patient be discharged home on 4 more days of oral antibiotics to complete a one-week course of antibiotic therapy.  #3 left knee swelling Patient was noted to have left knee swelling which was felt to be likely secondary to osteoarthritis. X-rays of the left knee were obtained that showed severe tricompartmental degenerative changes with a small effusion. Patient's pain was managed. She'll need to followup with M.D. or PCP as outpatient.   #4 hypokalemia Patient was noted to be hypokalemic on day of discharge. Patient's potassium was 3.1. Patient  was given potassium supplementation over total of 80 mEq of potassium. Patient will need a repeat be met done one week post discharge to followup on electrolytes and renal function.  The rest of patient's chronic medical issues remained stable throughout the  hospitalization and patient discharged in stable and improved condition.  Procedures:  X-ray of the knee 11/25/2012  X-ray of the foot 11/25/2012  Chest x-ray 11/25/2012  Upper extremity Dopplers 11/26/2012  Consultations:  None  Discharge Exam: Filed Vitals:   11/28/12 1400 11/28/12 2200 11/29/12 0500 11/29/12 0600  BP: 144/93 150/89  156/80  Pulse: 84 76  82  Temp: 97.6 F (36.4 C) 98.2 F (36.8 C)  97.8 F (36.6 C)  TempSrc: Oral Oral  Oral  Resp: 17 18  18   Height:      Weight:   59.013 kg (130 lb 1.6 oz)   SpO2: 97% 97%  98%    General: NAD Cardiovascular: RRR Respiratory: CTAB  Discharge Instructions      Discharge Orders   Future Orders Complete By Expires     Diet Carb Modified  As directed     Discharge instructions  As directed     Comments:      Follow up with MD at facility in 1 week.    Increase activity slowly  As directed         Medication List    STOP taking these medications       ciprofloxacin 250 MG tablet  Commonly known as:  CIPRO      TAKE these medications       acetaminophen 500 MG tablet  Commonly known as:  TYLENOL  Take 500 mg by mouth every 4 (four) hours as needed for pain (Max dose is 300 mg/24 hrs).     alum & mag hydroxide-simeth 200-200-20 MG/5ML suspension  Commonly known as:  MAALOX/MYLANTA  Take 30 mLs by mouth 4 (four) times daily as needed for indigestion.     aspirin 81 MG EC tablet  Take 81 mg by mouth every morning.     cephALEXin 500 MG capsule  Commonly known as:  KEFLEX  Take 1 capsule (500 mg total) by mouth 4 (four) times daily. Take for 4 days then stop.     donepezil 10 MG tablet  Commonly known as:  ARICEPT  Take 10 mg by mouth at bedtime.     esomeprazole 40 MG capsule  Commonly known as:  NEXIUM  Take 40 mg by mouth daily before breakfast.     guaiFENesin 100 MG/5ML liquid  Commonly known as:  ROBITUSSIN  Take 200 mg by mouth 4 (four) times daily as needed for cough.     insulin  aspart 100 UNIT/ML injection  Commonly known as:  novoLOG  Inject 0-12 Units into the skin 2 (two) times daily. Sliding scale; 251-300=6units, 301-350=8units, 351-400=10units, 401-450=12units; >450 call MD     insulin glargine 100 UNIT/ML injection  Commonly known as:  LANTUS  Inject 8 Units into the skin at bedtime.     levothyroxine 100 MCG tablet  Commonly known as:  SYNTHROID, LEVOTHROID  Take 100 mcg by mouth every morning.     loperamide 2 MG capsule  Commonly known as:  IMODIUM  Take 2 mg by mouth 4 (four) times daily as needed for diarrhea or loose stools.     magnesium hydroxide 400 MG/5ML suspension  Commonly known as:  MILK OF MAGNESIA  Take 30 mLs by mouth at bedtime as  needed for constipation.     memantine 10 MG tablet  Commonly known as:  NAMENDA  Take 10 mg by mouth 2 (two) times daily.     metoprolol tartrate 25 MG tablet  Commonly known as:  LOPRESSOR  Take 25 mg by mouth 2 (two) times daily.     nitroGLYCERIN 0.4 MG SL tablet  Commonly known as:  NITROSTAT  Place 0.4 mg under the tongue every 5 (five) minutes x 3 doses as needed for chest pain.     pravastatin 20 MG tablet  Commonly known as:  PRAVACHOL  Take 20 mg by mouth at bedtime.     sertraline 25 MG tablet  Commonly known as:  ZOLOFT  Take 25 mg by mouth every morning.     XARELTO 20 MG Tabs  Generic drug:  Rivaroxaban  Take 20 mg by mouth daily at 6 PM.       Allergies  Allergen Reactions  . Hydrocodone-Homatropine     Unknown  . Latex Rash    blisters   Follow-up Information   Schedule an appointment as soon as possible for a visit in 1 week to follow up. (f/u with MD at ALF in 1 week)       Please follow up. (Followup with PCP one week post discharge.)        The results of significant diagnostics from this hospitalization (including imaging, microbiology, ancillary and laboratory) are listed below for reference.    Significant Diagnostic Studies: Dg Chest 2 View  11/22/2012   *RADIOLOGY REPORT*  Clinical Data: Weakness  CHEST - 2 VIEW  Comparison: Chest radiograph 10/24/2012  Findings: Left-sided pacemaker overlies normal cardiac silhouette. No effusion, infiltrate, or pneumothorax.  Degenerative changes of the spine and without acute findings are present.  There is bronchitic markings centrally unchanged from prior.  IMPRESSION: No acute cardiopulmonary process.   Original Report Authenticated By: Genevive Bi, M.D.    Dg Wrist Complete Left  11/22/2012  *RADIOLOGY REPORT*  Clinical Data: 78 year old female with wrist bruising and swelling. Does not recall injury.  LEFT WRIST - COMPLETE 3+ VIEW  Comparison: None.  Findings: Osteopenia.  Calcified atherosclerosis in the distal forearm and wrist.  Distal radius and ulna appear intact.  Carpal bone alignment within normal limits.  Joint space loss and subchondral sclerosis along the radial aspect of the carpal bones and thumb metacarpal joint.  Metacarpals intact.  No acute fracture identified.  IMPRESSION: Osteopenia and degenerative changes. No acute fracture or dislocation identified about the left wrist.   Original Report Authenticated By: Erskine Speed, M.D.    Dg Knee 1-2 Views Left  11/25/2012  *RADIOLOGY REPORT*  Clinical Data: Left knee pain and swelling.  LEFT KNEE - 1-2 VIEW  Comparison: None  Findings: Tricompartmental degenerative changes noted, severe in the medial compartment. Chondrocalcinosis is noted. Small knee effusion is identified. There is no evidence of fracture or subluxation. No focal bony lesions are identified.  IMPRESSION: Tricompartmental degenerative changes, severe in the medial compartment, with small knee effusion.  Chondrocalcinosis.   Original Report Authenticated By: Harmon Pier, M.D.    Dg Chest Port 1 View  (if Code Sepsis Called)  11/25/2012  *RADIOLOGY REPORT*  Clinical Data: Fever  PORTABLE CHEST - 1 VIEW  Comparison: None.  Findings: The heart and pulmonary vascularity are stable.  A pacing  device is again seen.  The lungs are clear.  No pneumothorax is noted.  Visualized upper abdomen is unremarkable.  Chronic changes  in the proximal right humerus are seen.  IMPRESSION: No acute abnormality noted.   Original Report Authenticated By: Alcide Clever, M.D.    Dg Foot 2 Views Left  11/25/2012  *RADIOLOGY REPORT*  Clinical Data: Left heel ulcer and pain.  LEFT FOOT - 2 VIEW  Comparison: None  Findings: There is no evidence of fracture, subluxation or dislocation. No radiographic evidence of osteomyelitis is identified. Vascular calcifications are noted. Diffuse osteopenia is noted.  IMPRESSION: No evidence of acute bony abnormality.  Diffuse osteopenia.   Original Report Authenticated By: Harmon Pier, M.D.     Microbiology: Recent Results (from the past 240 hour(s))  URINE CULTURE     Status: None   Collection Time    11/21/12 10:28 PM      Result Value Range Status   Specimen Description URINE, CATHETERIZED   Final   Special Requests NONE   Final   Culture  Setup Time 11/22/2012 18:48   Final   Colony Count NO GROWTH   Final   Culture NO GROWTH   Final   Report Status 11/23/2012 FINAL   Final  CULTURE, BLOOD (ROUTINE X 2)     Status: None   Collection Time    11/21/12 11:36 PM      Result Value Range Status   Specimen Description BLOOD LEFT WRIST   Final   Special Requests BOTTLES DRAWN AEROBIC AND ANAEROBIC   Final   Culture  Setup Time 11/22/2012 03:49   Final   Culture NO GROWTH 5 DAYS   Final   Report Status 11/28/2012 FINAL   Final  CULTURE, BLOOD (ROUTINE X 2)     Status: None   Collection Time    11/21/12 11:43 PM      Result Value Range Status   Specimen Description BLOOD RIGHT ARM   Final   Special Requests BOTTLES DRAWN AEROBIC AND ANAEROBIC 5CC   Final   Culture  Setup Time 11/22/2012 03:49   Final   Culture NO GROWTH 5 DAYS   Final   Report Status 11/28/2012 FINAL   Final  MRSA PCR SCREENING     Status: None   Collection Time    11/22/12  6:40 AM       Result Value Range Status   MRSA by PCR NEGATIVE  NEGATIVE Final   Comment:            The GeneXpert MRSA Assay (FDA     approved for NASAL specimens     only), is one component of a     comprehensive MRSA colonization     surveillance program. It is not     intended to diagnose MRSA     infection nor to guide or     monitor treatment for     MRSA infections.  CULTURE, BLOOD (ROUTINE X 2)     Status: None   Collection Time    11/25/12  3:33 PM      Result Value Range Status   Specimen Description BLOOD BLOOD RIGHT FOREARM   Final   Special Requests BOTTLES DRAWN AEROBIC AND ANAEROBIC 10CC   Final   Culture  Setup Time 11/26/2012 00:26   Final   Culture     Final   Value:        BLOOD CULTURE RECEIVED NO GROWTH TO DATE CULTURE WILL BE HELD FOR 5 DAYS BEFORE ISSUING A FINAL NEGATIVE REPORT   Report Status PENDING   Incomplete  CULTURE, BLOOD (ROUTINE  X 2)     Status: None   Collection Time    11/25/12  3:39 PM      Result Value Range Status   Specimen Description BLOOD HAND RIGHT   Final   Special Requests BOTTLES DRAWN AEROBIC AND ANAEROBIC 10CC   Final   Culture  Setup Time 11/26/2012 00:28   Final   Culture     Final   Value:        BLOOD CULTURE RECEIVED NO GROWTH TO DATE CULTURE WILL BE HELD FOR 5 DAYS BEFORE ISSUING A FINAL NEGATIVE REPORT   Report Status PENDING   Incomplete  URINE CULTURE     Status: None   Collection Time    11/25/12  4:07 PM      Result Value Range Status   Specimen Description URINE, RANDOM   Final   Special Requests NONE   Final   Culture  Setup Time 11/25/2012 17:32   Final   Colony Count NO GROWTH   Final   Culture NO GROWTH   Final   Report Status 11/26/2012 FINAL   Final     Labs: Basic Metabolic Panel:  Recent Labs Lab 11/25/12 1532 11/25/12 1642 11/26/12 0829 11/28/12 0832 11/29/12 0542 11/29/12 0805  NA 135 136 138 139 140  --   K 4.1 3.9 3.7 3.4* 3.1*  --   CL 101 104 107 104 103  --   CO2 19  --  21 24 23   --   GLUCOSE  259* 249* 132* 166* 145*  --   BUN 18 18 11 7 7   --   CREATININE 1.19* 1.00 0.88 0.98 1.14*  --   CALCIUM 8.6  --  8.9 9.5 9.8  --   MG  --   --   --   --   --  1.8   Liver Function Tests:  Recent Labs Lab 11/25/12 1532 11/26/12 0829  AST 12 13  ALT 11 8  ALKPHOS 77 64  BILITOT 0.3 0.4  PROT 6.3 5.6*  ALBUMIN 2.9* 2.3*   No results found for this basename: LIPASE, AMYLASE,  in the last 168 hours No results found for this basename: AMMONIA,  in the last 168 hours CBC:  Recent Labs Lab 11/23/12 0621 11/25/12 1532 11/25/12 1642 11/26/12 0829 11/27/12 0425  WBC 9.4 12.6*  --  8.0 8.2  NEUTROABS 6.2 10.6*  --  5.1  --   HGB 9.8* 9.9* 9.5* 8.7* 8.7*  HCT 29.6* 29.3* 28.0* 26.2* 26.3*  MCV 81.3 80.5  --  81.9 81.2  PLT 214 261  --  206 232   Cardiac Enzymes: No results found for this basename: CKTOTAL, CKMB, CKMBINDEX, TROPONINI,  in the last 168 hours BNP: BNP (last 3 results) No results found for this basename: PROBNP,  in the last 8760 hours CBG:  Recent Labs Lab 11/28/12 0639 11/28/12 1139 11/28/12 1629 11/28/12 2141 11/29/12 0639  GLUCAP 152* 175* 174* 159* 138*       Signed:  Shaunika Italiano  [facility] 11/29/2012, 3:04 PM

## 2012-11-29 NOTE — ED Notes (Signed)
Spoke with Sarah Yang at [facility] who approved pt to return to facility this pm.  Pt's dtr., Sarah Yang, called and agreeable to tx.  [facility] arranged for transport.

## 2012-11-29 NOTE — Progress Notes (Signed)
   CARE MANAGEMENT NOTE 11/29/2012  Patient:  Sarah Yang, Sarah Yang   Account Number:  [id number]  Date Initiated:  11/28/2012  Documentation initiated by:  [facility]  Subjective/Objective Assessment:   acute encephalopathy possibly secondary to UTI     Action/Plan:   lives at [facility]   Anticipated DC Date:  11/30/2012   Anticipated DC Plan:  HOME W HOME HEALTH SERVICES      DC Planning Services  CM consult      [facility] Choice  Resumption Of Svcs/PTA Provider   Choice offered to / List presented to:     DME arranged  3-N-1  WHEELCHAIR - MANUAL      DME agency  [facility]     [facility] arranged  HH-2 PT  HH-1 RN      [facility] agency  [facility]   Status of service:  Completed, signed off Medicare Important Message given?   (If response is "NO", the following Medicare IM given date fields will be blank) Date Medicare IM given:   Date Additional Medicare IM given:    Discharge Disposition:  HOME W HOME HEALTH SERVICES  Per UR Regulation:    If discussed at Long Length of Stay Meetings, dates discussed:    Comments:  11/29/2012 1500 Contacted Genevieve Norlander for [facility] RN, PT, OT aide and SW. [facility] will deliver DME to ALF. Isidoro Donning RN CCM Case Mgmt phone [phone]  11/28/2012 1400 NCM spoke to [facility] rep and pt active with Genevieve Norlander for [facility] PT. Will need resumption of care orders at dc. Isidoro Donning RN CCM Case Mgmt phone [phone]

## 2012-11-29 NOTE — Clinical Social Work Note (Signed)
CSW confirmed with [facility] ALF they are able to accept pt when medically clear. CSW will continue to follow.   Dellie Burns, MSW, [place] [phone] (coverage)

## 2012-11-29 NOTE — Progress Notes (Signed)
Occupational Therapy Treatment Patient Details Name: Sarah Yang MRN: [id number] DOB: 18-Sep-1934 Today's Date: 11/29/2012 Time: 7829-5621 OT Time Calculation (min): 26 min  OT Assessment / Plan / Recommendation Comments on Treatment Session Pt with improved ability to participate in basic self care activities    Follow Up Recommendations  Home health OT;Supervision/Assistance - 24 hour    Barriers to Discharge       Equipment Recommendations  3 in 1 bedside comode;Wheelchair (measurements OT);Wheelchair cushion (measurements OT)    Recommendations for Other Services    Frequency Min 2X/week   Plan Discharge plan needs to be updated (Pt for plan to discharge back to ALF)    Precautions / Restrictions Precautions Precautions: Fall;Other (comment) Restrictions Weight Bearing Restrictions: No   Pertinent Vitals/Pain     ADL  Eating/Feeding: Set up;Supervision/safety Where Assessed - Eating/Feeding: Bed level Grooming: Wash/dry hands;Wash/dry face;Set up Where Assessed - Grooming: Supine, head of bed up ADL Comments: Pt asleep initially - max cues to arouse.   Once pt awake, she was able to perform grooming and self feeding with set up assist    OT Diagnosis:    OT Problem List:   OT Treatment Interventions:     OT Goals Acute Rehab OT Goals Time For Goal Achievement: 12/12/12 ADL Goals Pt Will Perform Eating: with set-up;Supported;Sitting, chair ADL Goal: Eating - Progress: Met Pt Will Perform Grooming: with set-up;Sitting, chair;Supported;with cueing (comment type and amount) ADL Goal: Grooming - Progress: Met  Visit Information  Last OT Received On: 11/29/12 Assistance Needed: +2    Subjective Data      Prior Functioning       Cognition  Cognition Arousal/Alertness: Lethargic Behavior During Therapy: WFL for tasks assessed/performed Overall Cognitive Status: History of cognitive impairments - at baseline    Mobility       Exercises       Balance     End of Session OT - End of Session Activity Tolerance: Patient tolerated treatment well Patient left: in bed;with call bell/phone within reach;with nursing in room  GO     Sarah Yang 11/29/2012, 11:34 AM

## 2012-12-02 LAB — CULTURE, BLOOD (ROUTINE X 2): Culture: NO GROWTH

## 2013-01-15 ENCOUNTER — Emergency Department (HOSPITAL_COMMUNITY)
Admission: EM | Admit: 2013-01-15 | Discharge: 2013-01-16 | Disposition: A | Discharge status: Skilled Nursing Facility | Attending: Emergency Medicine | Admitting: Emergency Medicine

## 2013-01-15 ENCOUNTER — Encounter (HOSPITAL_COMMUNITY): Payer: Self-pay | Admitting: Emergency Medicine

## 2013-01-15 DIAGNOSIS — Z7901 Long term (current) use of anticoagulants: Secondary | ICD-10-CM | POA: Insufficient documentation

## 2013-01-15 DIAGNOSIS — G309 Alzheimer's disease, unspecified: Secondary | ICD-10-CM | POA: Insufficient documentation

## 2013-01-15 DIAGNOSIS — Z794 Long term (current) use of insulin: Secondary | ICD-10-CM | POA: Insufficient documentation

## 2013-01-15 DIAGNOSIS — Z91040 Latex allergy status: Secondary | ICD-10-CM | POA: Insufficient documentation

## 2013-01-15 DIAGNOSIS — Z79899 Other long term (current) drug therapy: Secondary | ICD-10-CM | POA: Insufficient documentation

## 2013-01-15 DIAGNOSIS — E1169 Type 2 diabetes mellitus with other specified complication: Secondary | ICD-10-CM | POA: Insufficient documentation

## 2013-01-15 DIAGNOSIS — Z7982 Long term (current) use of aspirin: Secondary | ICD-10-CM | POA: Insufficient documentation

## 2013-01-15 DIAGNOSIS — I10 Essential (primary) hypertension: Secondary | ICD-10-CM | POA: Insufficient documentation

## 2013-01-15 DIAGNOSIS — F0280 Dementia in other diseases classified elsewhere without behavioral disturbance: Secondary | ICD-10-CM | POA: Insufficient documentation

## 2013-01-15 DIAGNOSIS — E039 Hypothyroidism, unspecified: Secondary | ICD-10-CM | POA: Insufficient documentation

## 2013-01-15 DIAGNOSIS — R739 Hyperglycemia, unspecified: Secondary | ICD-10-CM

## 2013-01-15 DIAGNOSIS — Z950 Presence of cardiac pacemaker: Secondary | ICD-10-CM | POA: Insufficient documentation

## 2013-01-15 DIAGNOSIS — Z905 Acquired absence of kidney: Secondary | ICD-10-CM | POA: Insufficient documentation

## 2013-01-15 LAB — CBC WITH DIFFERENTIAL/PLATELET
Basophils Relative: 0 % (ref 0–1)
Eosinophils Relative: 0 % (ref 0–5)
HCT: 33.7 % — ABNORMAL LOW (ref 36.0–46.0)
Hemoglobin: 10.9 g/dL — ABNORMAL LOW (ref 12.0–15.0)
MCH: 26.5 pg (ref 26.0–34.0)
MCHC: 32.3 g/dL (ref 30.0–36.0)
MCV: 81.8 fL (ref 78.0–100.0)
Monocytes Absolute: 0.6 10*3/uL (ref 0.1–1.0)
Monocytes Relative: 6 % (ref 3–12)
Neutro Abs: 7.3 10*3/uL (ref 1.7–7.7)

## 2013-01-15 LAB — GLUCOSE, CAPILLARY
Glucose-Capillary: 483 mg/dL — ABNORMAL HIGH (ref 70–99)
Glucose-Capillary: 375 mg/dL — ABNORMAL HIGH (ref 70–99)
Glucose-Capillary: 230 mg/dL — ABNORMAL HIGH (ref 70–99)

## 2013-01-15 LAB — POCT I-STAT, CHEM 8
BUN: 20 mg/dL (ref 6–23)
Calcium, Ion: 1.2 mmol/L (ref 1.13–1.30)
Chloride: 104 meq/L (ref 96–112)
Creatinine, Ser: 1.2 mg/dL — ABNORMAL HIGH (ref 0.50–1.10)
Glucose, Bld: 492 mg/dL — ABNORMAL HIGH (ref 70–99)
HCT: 33 % — ABNORMAL LOW (ref 36.0–46.0)
Hemoglobin: 11.2 g/dL — ABNORMAL LOW (ref 12.0–15.0)
Potassium: 4.5 meq/L (ref 3.5–5.1)
Sodium: 137 mEq/L (ref 135–145)
TCO2: 20 mmol/L (ref 0–100)

## 2013-01-15 LAB — URINALYSIS, ROUTINE W REFLEX MICROSCOPIC
Bilirubin Urine: NEGATIVE
Hgb urine dipstick: NEGATIVE
Ketones, ur: NEGATIVE mg/dL
Specific Gravity, Urine: 1.034 — ABNORMAL HIGH (ref 1.005–1.030)
Urobilinogen, UA: 0.2 mg/dL (ref 0.0–1.0)

## 2013-01-15 LAB — URINE MICROSCOPIC-ADD ON

## 2013-01-15 MED ORDER — SODIUM CHLORIDE 0.9 % IV SOLN
1000.0000 mL | Freq: Once | INTRAVENOUS | Status: AC
  Administered 2013-01-15: 1000 mL via INTRAVENOUS

## 2013-01-15 MED ORDER — SODIUM CHLORIDE 0.9 % IV SOLN: 1000.0000 mL | INTRAVENOUS | Status: DC

## 2013-01-15 MED ORDER — INSULIN ASPART 100 UNIT/ML ~~LOC~~ SOLN
10.0000 [IU] | Freq: Once | SUBCUTANEOUS | Status: AC
  Administered 2013-01-15: 10 [IU] via SUBCUTANEOUS
  Filled 2013-01-15: qty 1

## 2013-01-15 MED ORDER — SODIUM CHLORIDE 0.9 % IV SOLN: 1000.0000 mL | Freq: Once | INTRAVENOUS | Status: DC

## 2013-01-15 MED ORDER — SODIUM CHLORIDE 0.9 % IV SOLN
INTRAVENOUS | Status: DC
  Filled 2013-01-15: qty 1

## 2013-01-15 NOTE — ED Notes (Signed)
CBG checked 375 RN informed

## 2013-01-15 NOTE — ED Notes (Signed)
Sarah Tran, PA at bedside 

## 2013-01-15 NOTE — ED Provider Notes (Signed)
Medical screening examination/treatment/procedure(s) were conducted as a shared visit with non-physician practitioner(s) and myself.  I personally evaluated the patient during the encounter  Pt with h/o DM, by history, glucose have been steadily getting higher over a few days, on call physician wasn't readily available so patient sent to the ED.  Pt denies pain, HA, CP, SOB, fevers.  No ketones in urine and pt denies n/V.  Pt is hyperglycemic to 450 without suspected DKA.  Will lower some here and send back to facility and pt needs outpt follow up in the next couple of days to manage DM.    Impression: Hyperglycemia without ketosis with h/o DM  Sarah Yang. Oletta Lamas, MD 01/15/13 2035

## 2013-01-15 NOTE — ED Provider Notes (Signed)
History    CSN: [id number] Arrival date & time 01/15/13  1827  First MD Initiated Contact with Patient 01/15/13 1848     Chief Complaint  Patient presents with  . Hyperglycemia   (Consider location/radiation/quality/duration/timing/severity/associated sxs/prior Treatment) HPI  78 year old female with history of dementia, insulin-dependent diabetes, hypertension was brought here via EMS from [facility] for evaluations of high blood sugar.  history is limited due to patient's dementia. Per EMS, patient was found to have a blood sugar of 453 earlier today when she had her CBG checked by staff at nursing home. Patient has no specific complaints. She denies headache, chest pain, shortness of breath, abdominal pain, nausea, vomiting, diarrhea, or having any recent sickness. It is unknown if the patient has been taking her medication.  Past Medical History  Diagnosis Date  . Diabetes mellitus without complication   . Hypertension   . Dementia   . Hypothyroidism   . Alzheimer disease    Past Surgical History  Procedure Laterality Date  . Pacemaker insertion    . Nephrectomy  1989   No family history on file. History  Substance Use Topics  . Smoking status: Never Smoker   . Smokeless tobacco: Never Used  . Alcohol Use: No   OB History   Grav Para Term Preterm Abortions TAB SAB Ect Mult Living                 Review of Systems  Unable to perform ROS: Dementia    Allergies  Hydrocodone-homatropine and Latex  Home Medications   Current Outpatient Rx  Name  Route  Sig  Dispense  Refill  . acetaminophen (TYLENOL) 500 MG tablet   Oral   Take 500 mg by mouth every 4 (four) hours as needed for pain (Max dose is 300 mg/24 hrs).         Marland Kitchen aspirin 81 MG EC tablet   Oral   Take 81 mg by mouth every morning.         . clonazePAM (KLONOPIN) 1 MG tablet   Oral   Take 0.5 mg by mouth 3 (three) times daily as needed for anxiety.         . donepezil  (ARICEPT) 10 MG tablet   Oral   Take 10 mg by mouth at bedtime.         Marland Kitchen guaiFENesin (ROBITUSSIN) 100 MG/5ML liquid   Oral   Take 200 mg by mouth 4 (four) times daily as needed for cough.         Marland Kitchen HYDROcodone-acetaminophen (NORCO/VICODIN) 5-325 MG per tablet   Oral   Take 1 tablet by mouth 3 (three) times daily as needed for pain.         Marland Kitchen insulin aspart (NOVOLOG) 100 UNIT/ML injection   Subcutaneous   Inject 0-12 Units into the skin 2 (two) times daily. Sliding scale; 251-300=6units, 301-350=8units, 351-400=10units, 401-450=12units; >450 call MD         . insulin glargine (LANTUS) 100 UNIT/ML injection   Subcutaneous   Inject 12 Units into the skin at bedtime.          Marland Kitchen levothyroxine (SYNTHROID, LEVOTHROID) 100 MCG tablet   Oral   Take 100 mcg by mouth every morning.          . loperamide (IMODIUM) 2 MG capsule   Oral   Take 2 mg by mouth 4 (four) times daily as needed for diarrhea or loose stools.         Marland Kitchen  magnesium hydroxide (MILK OF MAGNESIA) 400 MG/5ML suspension   Oral   Take 30 mLs by mouth at bedtime as needed for constipation.         . memantine (NAMENDA) 10 MG tablet   Oral   Take 10 mg by mouth 2 (two) times daily.         . metoprolol tartrate (LOPRESSOR) 25 MG tablet   Oral   Take 25 mg by mouth 2 (two) times daily.         . mirtazapine (REMERON) 15 MG tablet   Oral   Take 15 mg by mouth at bedtime.         . nitroGLYCERIN (NITROSTAT) 0.4 MG SL tablet   Sublingual   Place 0.4 mg under the tongue every 5 (five) minutes x 3 doses as needed for chest pain.          Marland Kitchen omeprazole (PRILOSEC) 20 MG capsule   Oral   Take 40 mg by mouth every morning.         . predniSONE (DELTASONE) 10 MG tablet   Oral   Take 20 mg by mouth daily.         . Rivaroxaban (XARELTO) 20 MG TABS   Oral   Take 20 mg by mouth daily.          Marland Kitchen senna-docusate (SENOKOT-S) 8.6-50 MG per tablet   Oral   Take 1 tablet by mouth 2 (two) times  daily.          BP 129/84  Pulse 90  Temp(Src) 97.6 F (36.4 C) (Oral)  Resp 18  SpO2 100% Physical Exam  Nursing note and vitals reviewed. Constitutional: She appears well-developed and well-nourished. No distress.  Patient is pleasant, alert, and in no acute distress.  HENT:  Head: Normocephalic and atraumatic.  Mouth/Throat: Oropharynx is clear and moist.  Oral mucosal moist  Eyes: Conjunctivae are normal.  Neck: Neck supple.  Cardiovascular: Normal rate and regular rhythm.   Pulmonary/Chest: Effort normal and breath sounds normal.  Abdominal: Soft. There is no tenderness.  Musculoskeletal: She exhibits no edema.  Neurological:  Patient is alert to self only.  5 out 5 strength to all 4 extremities. Normal gait.  Skin: Skin is warm.  Psychiatric: She has a normal mood and affect.    ED Course  Procedures (including critical care time)   Date: 01/15/2013  Rate: 84  Rhythm: atrial paced complexes  QRS Axis: normal  Intervals: QT prolonged  ST/T Wave abnormalities: normal  Conduction Disutrbances:reconstructed pacer spikes, borderline QT prolongation  Narrative Interpretation:   Old EKG Reviewed: unchanged    7:09 PM Pt here for management of elevated CBG in the 400s.  Pt appears comfortable, vss.    7:22 PM According to staff from nursing home, patient has had high blood sugar for the past week. Her medication has not been adjusted. She usually receive her medication through her nurse.  On-call doctor is not available and therefore patient was sent here to ED for further management of her elevated blood sugar. No other complaint noted.    9:12 PM CBG iimproves.  No source of infection and no significant electrolytes abnormalities noted.  No evidence of DKA.  Pt will need to have her diabetic medication adjust for better coverage of her CBG.  Otherwise pt stable for discharge.  Care discussed with attending.    Labs Reviewed  GLUCOSE, CAPILLARY - Abnormal;  Notable for the following:    Glucose-Capillary 483 (*)  All other components within normal limits  CBC WITH DIFFERENTIAL - Abnormal; Notable for the following:    Hemoglobin 10.9 (*)    HCT 33.7 (*)    RDW 15.8 (*)    All other components within normal limits  URINALYSIS, ROUTINE W REFLEX MICROSCOPIC - Abnormal; Notable for the following:    Specific Gravity, Urine 1.034 (*)    Glucose, UA >1000 (*)    All other components within normal limits  URINE MICROSCOPIC-ADD ON   No results found. 1. Hyperglycemia without ketosis     MDM  BP 144/88  Pulse 83  Temp(Src) 97.6 F (36.4 C) (Oral)  Resp 16  SpO2 100%     Fayrene Helper, PA-C 01/15/13 2113

## 2013-01-15 NOTE — ED Notes (Signed)
Pt to ED via [facility] from [facility] for evaluation of hyperglycemia.  CBG 453 for EMS.  Unknown if pt has been taking medications.  Pt with hx of dementia, denies any complaints at this time.

## 2013-01-15 NOTE — ED Notes (Signed)
Old and New EKG given to Dr Ghim 

## 2013-02-02 ENCOUNTER — Encounter (HOSPITAL_COMMUNITY): Payer: Self-pay | Admitting: Emergency Medicine

## 2013-02-02 ENCOUNTER — Emergency Department (HOSPITAL_COMMUNITY)
Admission: EM | Admit: 2013-02-02 | Discharge: 2013-02-02 | Disposition: A | Discharge status: Home / Self Care | Attending: Emergency Medicine | Admitting: Emergency Medicine

## 2013-02-02 DIAGNOSIS — I10 Essential (primary) hypertension: Secondary | ICD-10-CM | POA: Insufficient documentation

## 2013-02-02 DIAGNOSIS — G309 Alzheimer's disease, unspecified: Secondary | ICD-10-CM | POA: Insufficient documentation

## 2013-02-02 DIAGNOSIS — Z91040 Latex allergy status: Secondary | ICD-10-CM | POA: Insufficient documentation

## 2013-02-02 DIAGNOSIS — F0280 Dementia in other diseases classified elsewhere without behavioral disturbance: Secondary | ICD-10-CM | POA: Insufficient documentation

## 2013-02-02 DIAGNOSIS — S0990XA Unspecified injury of head, initial encounter: Secondary | ICD-10-CM | POA: Insufficient documentation

## 2013-02-02 DIAGNOSIS — E039 Hypothyroidism, unspecified: Secondary | ICD-10-CM | POA: Insufficient documentation

## 2013-02-02 DIAGNOSIS — Z7901 Long term (current) use of anticoagulants: Secondary | ICD-10-CM | POA: Insufficient documentation

## 2013-02-02 DIAGNOSIS — Y9210 Unspecified residential institution as the place of occurrence of the external cause: Secondary | ICD-10-CM | POA: Insufficient documentation

## 2013-02-02 DIAGNOSIS — W06XXXA Fall from bed, initial encounter: Secondary | ICD-10-CM | POA: Insufficient documentation

## 2013-02-02 DIAGNOSIS — Z79899 Other long term (current) drug therapy: Secondary | ICD-10-CM | POA: Insufficient documentation

## 2013-02-02 DIAGNOSIS — Z794 Long term (current) use of insulin: Secondary | ICD-10-CM | POA: Insufficient documentation

## 2013-02-02 DIAGNOSIS — E119 Type 2 diabetes mellitus without complications: Secondary | ICD-10-CM | POA: Insufficient documentation

## 2013-02-02 DIAGNOSIS — Y939 Activity, unspecified: Secondary | ICD-10-CM | POA: Insufficient documentation

## 2013-02-02 DIAGNOSIS — W19XXXA Unspecified fall, initial encounter: Secondary | ICD-10-CM

## 2013-02-02 DIAGNOSIS — Z7982 Long term (current) use of aspirin: Secondary | ICD-10-CM | POA: Insufficient documentation

## 2013-02-02 DIAGNOSIS — Z950 Presence of cardiac pacemaker: Secondary | ICD-10-CM | POA: Insufficient documentation

## 2013-02-02 NOTE — ED Provider Notes (Signed)
History    CSN: [id number] Arrival date & time 02/02/13  4098  First MD Initiated Contact with Patient 02/02/13 0559     Chief Complaint  Patient presents with  . Fall   Level V caveat: Dementia  HPI Patient presents from the nursing home after rolling out of bed and reportedly hitting her head.  Nursing staff in emergency [facility] were unable to find any obvious injury to her scalp.  Patient has a history of dementia and therefore she is unable to provide any significant history.  The patient is on anticoagulants.  The patient denies any pain at this time.   Past Medical History  Diagnosis Date  . Diabetes mellitus without complication   . Hypertension   . Dementia   . Hypothyroidism   . Alzheimer disease    Past Surgical History  Procedure Laterality Date  . Pacemaker insertion    . Nephrectomy  1989   History reviewed. No pertinent family history. History  Substance Use Topics  . Smoking status: Never Smoker   . Smokeless tobacco: Never Used  . Alcohol Use: No   OB History   Grav Para Term Preterm Abortions TAB SAB Ect Mult Living                 Review of Systems  Unable to perform ROS: Dementia    Allergies  Hydrocodone-homatropine and Latex  Home Medications   Current Outpatient Rx  Name  Route  Sig  Dispense  Refill  . aspirin EC 81 MG tablet   Oral   Take 81 mg by mouth daily.         . clonazePAM (KLONOPIN) 1 MG tablet   Oral   Take 0.5 mg by mouth 3 (three) times daily as needed for anxiety.         . donepezil (ARICEPT) 10 MG tablet   Oral   Take 10 mg by mouth at bedtime.         Marland Kitchen HYDROcodone-acetaminophen (NORCO/VICODIN) 5-325 MG per tablet   Oral   Take 1 tablet by mouth 3 (three) times daily as needed for pain.         Marland Kitchen insulin aspart (NOVOLOG) 100 UNIT/ML injection   Subcutaneous   Inject 0-12 Units into the skin 2 (two) times daily. Sliding scale; 251-300=6units, 301-350=8units, 351-400=10units, 401-450=12units;  >450 call MD         . insulin glargine (LANTUS) 100 UNIT/ML injection   Subcutaneous   Inject 16 Units into the skin at bedtime.          Marland Kitchen levothyroxine (SYNTHROID, LEVOTHROID) 100 MCG tablet   Oral   Take 100 mcg by mouth every morning.          . memantine (NAMENDA) 10 MG tablet   Oral   Take 10 mg by mouth 2 (two) times daily.         . Memantine HCl ER (NAMENDA XR) 28 MG CP24   Oral   Take 1 capsule by mouth daily.         . metoprolol tartrate (LOPRESSOR) 25 MG tablet   Oral   Take 25 mg by mouth 2 (two) times daily.         . mirtazapine (REMERON) 15 MG tablet   Oral   Take 15 mg by mouth at bedtime.         Marland Kitchen omeprazole (PRILOSEC) 20 MG capsule   Oral   Take 40 mg by mouth  every morning.         . Rivaroxaban (XARELTO) 20 MG TABS   Oral   Take 20 mg by mouth daily.          Marland Kitchen senna-docusate (SENOKOT-S) 8.6-50 MG per tablet   Oral   Take 1 tablet by mouth 2 (two) times daily.         . simethicone (MYLICON) 80 MG chewable tablet   Oral   Chew 80 mg by mouth 3 (three) times daily.         Marland Kitchen acetaminophen (TYLENOL) 500 MG tablet   Oral   Take 500 mg by mouth every 4 (four) hours as needed for pain (Max dose is 300 mg/24 hrs).         . guaiFENesin (ROBITUSSIN) 100 MG/5ML liquid   Oral   Take 200 mg by mouth 4 (four) times daily as needed for cough.         . loperamide (IMODIUM) 2 MG capsule   Oral   Take 2 mg by mouth 4 (four) times daily as needed for diarrhea or loose stools.         . magnesium hydroxide (MILK OF MAGNESIA) 400 MG/5ML suspension   Oral   Take 30 mLs by mouth at bedtime as needed for constipation.         . nitroGLYCERIN (NITROSTAT) 0.4 MG SL tablet   Sublingual   Place 0.4 mg under the tongue every 5 (five) minutes x 3 doses as needed for chest pain.           BP 130/87  Pulse 80  Temp(Src) 97.8 F (36.6 C) (Oral)  Resp 16  SpO2 100% Physical Exam  Nursing note and vitals  reviewed. Constitutional: She appears well-developed and well-nourished. No distress.  HENT:  Head: Normocephalic and atraumatic.  Eyes: EOM are normal.  Neck: Normal range of motion. Neck supple.  No cervical spine tenderness  Cardiovascular: Normal rate, regular rhythm and normal heart sounds.   Pulmonary/Chest: Effort normal and breath sounds normal. She exhibits no tenderness.  Abdominal: Soft. She exhibits no distension. There is no tenderness. There is no rebound.  Musculoskeletal: Normal range of motion.  Full range of motion of bilateral wrists elbows and shoulders.  Full range of motion of bilateral ankles hips and knees.  Old bruising to her bilateral lower extremities noted without any obvious new hematoma  Neurological: She is alert.  Follows commands .  5 out of 5 strength in bilateral upper lower extremity major muscle groups  Skin: Skin is warm and dry.  Psychiatric: She has a normal mood and affect. Judgment normal.    ED Course  Procedures (including critical care time) Labs Reviewed - No data to display No results found. 1. Fall at nursing home, initial encounter     MDM  Scalp is atraumatic.  No obvious injury noted.  Full range of motion bilateral hips.  The patient is not on any anticoagulants.  Her vital signs are normal.  We'll discharge the patient back home at this time without any imaging.  Lyanne Co, MD 02/02/13 [phone]

## 2013-02-02 NOTE — ED Notes (Addendum)
Per EMS: Pt comes from [facility], Pt reports rolling out of bed hit her head unable to identify where head was injured. C/o pain and swelling on left leg.

## 2013-02-02 NOTE — ED Notes (Signed)
[id number]<AV> Expected date:<BR> Expected time:<BR> Means of arrival:<BR> Comments:<BR> EMS 78yo F; fall

## 2013-02-02 NOTE — ED Notes (Signed)
Report called facility

## 2013-11-26 ENCOUNTER — Encounter (HOSPITAL_COMMUNITY): Payer: Self-pay | Admitting: Emergency Medicine

## 2013-11-26 ENCOUNTER — Inpatient Hospital Stay (HOSPITAL_COMMUNITY)
Admission: EM | Admit: 2013-11-26 | Discharge: 2013-11-29 | DRG: 092 | Disposition: A | Discharge status: Hospice | Payer: Medicare Other | Attending: Family Medicine | Admitting: Family Medicine

## 2013-11-26 ENCOUNTER — Emergency Department (HOSPITAL_COMMUNITY): Payer: Medicare Other

## 2013-11-26 DIAGNOSIS — Z7982 Long term (current) use of aspirin: Secondary | ICD-10-CM

## 2013-11-26 DIAGNOSIS — Z885 Allergy status to narcotic agent status: Secondary | ICD-10-CM

## 2013-11-26 DIAGNOSIS — Z66 Do not resuscitate: Secondary | ICD-10-CM | POA: Diagnosis present

## 2013-11-26 DIAGNOSIS — G309 Alzheimer's disease, unspecified: Secondary | ICD-10-CM | POA: Diagnosis present

## 2013-11-26 DIAGNOSIS — G9340 Encephalopathy, unspecified: Secondary | ICD-10-CM | POA: Diagnosis present

## 2013-11-26 DIAGNOSIS — D649 Anemia, unspecified: Secondary | ICD-10-CM | POA: Diagnosis present

## 2013-11-26 DIAGNOSIS — D696 Thrombocytopenia, unspecified: Secondary | ICD-10-CM | POA: Diagnosis present

## 2013-11-26 DIAGNOSIS — G929 Unspecified toxic encephalopathy: Principal | ICD-10-CM | POA: Diagnosis present

## 2013-11-26 DIAGNOSIS — Z803 Family history of malignant neoplasm of breast: Secondary | ICD-10-CM

## 2013-11-26 DIAGNOSIS — B9689 Other specified bacterial agents as the cause of diseases classified elsewhere: Secondary | ICD-10-CM | POA: Diagnosis present

## 2013-11-26 DIAGNOSIS — N760 Acute vaginitis: Secondary | ICD-10-CM | POA: Diagnosis present

## 2013-11-26 DIAGNOSIS — K219 Gastro-esophageal reflux disease without esophagitis: Secondary | ICD-10-CM | POA: Diagnosis present

## 2013-11-26 DIAGNOSIS — I4891 Unspecified atrial fibrillation: Secondary | ICD-10-CM | POA: Diagnosis present

## 2013-11-26 DIAGNOSIS — Z23 Encounter for immunization: Secondary | ICD-10-CM

## 2013-11-26 DIAGNOSIS — A499 Bacterial infection, unspecified: Secondary | ICD-10-CM

## 2013-11-26 DIAGNOSIS — Z794 Long term (current) use of insulin: Secondary | ICD-10-CM

## 2013-11-26 DIAGNOSIS — I10 Essential (primary) hypertension: Secondary | ICD-10-CM | POA: Diagnosis present

## 2013-11-26 DIAGNOSIS — F0391 Unspecified dementia with behavioral disturbance: Secondary | ICD-10-CM | POA: Diagnosis present

## 2013-11-26 DIAGNOSIS — Z950 Presence of cardiac pacemaker: Secondary | ICD-10-CM

## 2013-11-26 DIAGNOSIS — E039 Hypothyroidism, unspecified: Secondary | ICD-10-CM | POA: Diagnosis present

## 2013-11-26 DIAGNOSIS — F29 Unspecified psychosis not due to a substance or known physiological condition: Secondary | ICD-10-CM | POA: Diagnosis present

## 2013-11-26 DIAGNOSIS — N898 Other specified noninflammatory disorders of vagina: Secondary | ICD-10-CM

## 2013-11-26 DIAGNOSIS — F03918 Unspecified dementia, unspecified severity, with other behavioral disturbance: Secondary | ICD-10-CM | POA: Diagnosis present

## 2013-11-26 DIAGNOSIS — E119 Type 2 diabetes mellitus without complications: Secondary | ICD-10-CM | POA: Diagnosis present

## 2013-11-26 DIAGNOSIS — Z853 Personal history of malignant neoplasm of breast: Secondary | ICD-10-CM

## 2013-11-26 DIAGNOSIS — F0280 Dementia in other diseases classified elsewhere without behavioral disturbance: Secondary | ICD-10-CM | POA: Diagnosis present

## 2013-11-26 DIAGNOSIS — Z9010 Acquired absence of unspecified breast and nipple: Secondary | ICD-10-CM

## 2013-11-26 DIAGNOSIS — G92 Toxic encephalopathy: Principal | ICD-10-CM | POA: Diagnosis present

## 2013-11-26 DIAGNOSIS — F0390 Unspecified dementia without behavioral disturbance: Secondary | ICD-10-CM | POA: Diagnosis present

## 2013-11-26 DIAGNOSIS — E860 Dehydration: Secondary | ICD-10-CM | POA: Diagnosis present

## 2013-11-26 LAB — CBC WITH DIFFERENTIAL/PLATELET
BASOS PCT: 0 % (ref 0–1)
Basophils Absolute: 0 10*3/uL (ref 0.0–0.1)
Eosinophils Absolute: 0.1 10*3/uL (ref 0.0–0.7)
Eosinophils Relative: 1 % (ref 0–5)
HEMATOCRIT: 37.1 % (ref 36.0–46.0)
Hemoglobin: 11.8 g/dL — ABNORMAL LOW (ref 12.0–15.0)
LYMPHS PCT: 17 % (ref 12–46)
Lymphs Abs: 1.5 10*3/uL (ref 0.7–4.0)
MCH: 27.5 pg (ref 26.0–34.0)
MCHC: 31.8 g/dL (ref 30.0–36.0)
MCV: 86.5 fL (ref 78.0–100.0)
Monocytes Absolute: 1.1 10*3/uL — ABNORMAL HIGH (ref 0.1–1.0)
Monocytes Relative: 12 % (ref 3–12)
NEUTROS ABS: 6.3 10*3/uL (ref 1.7–7.7)
NEUTROS PCT: 70 % (ref 43–77)
PLATELETS: 136 10*3/uL — AB (ref 150–400)
RBC: 4.29 MIL/uL (ref 3.87–5.11)
RDW: 15.1 % (ref 11.5–15.5)
WBC: 8.9 10*3/uL (ref 4.0–10.5)

## 2013-11-26 LAB — COMPREHENSIVE METABOLIC PANEL
ALT: 10 U/L (ref 0–35)
AST: 27 U/L (ref 0–37)
Albumin: 3.4 g/dL — ABNORMAL LOW (ref 3.5–5.2)
Alkaline Phosphatase: 53 U/L (ref 39–117)
BILIRUBIN TOTAL: 0.5 mg/dL (ref 0.3–1.2)
BUN: 23 mg/dL (ref 6–23)
CHLORIDE: 106 meq/L (ref 96–112)
CO2: 22 meq/L (ref 19–32)
CREATININE: 1.24 mg/dL — AB (ref 0.50–1.10)
Calcium: 9.2 mg/dL (ref 8.4–10.5)
GFR calc Af Amer: 48 mL/min — ABNORMAL LOW (ref >90)
GFR, EST NON AFRICAN AMERICAN: 41 mL/min — AB (ref >90)
Glucose, Bld: 151 mg/dL — ABNORMAL HIGH (ref 70–99)
POTASSIUM: 5.4 meq/L — AB (ref 3.7–5.3)
SODIUM: 146 meq/L (ref 137–147)
Total Protein: 6.6 g/dL (ref 6.0–8.3)

## 2013-11-26 LAB — WET PREP, GENITAL
TRICH WET PREP: NONE SEEN
Yeast Wet Prep HPF POC: NONE SEEN

## 2013-11-26 LAB — I-STAT TROPONIN, ED: Troponin i, poc: 0.01 ng/mL (ref 0.00–0.08)

## 2013-11-26 LAB — LIPASE, BLOOD: Lipase: 14 U/L (ref 11–59)

## 2013-11-26 LAB — I-STAT CG4 LACTIC ACID, ED: LACTIC ACID, VENOUS: 1.75 mmol/L (ref 0.5–2.2)

## 2013-11-26 MED ORDER — DOXYCYCLINE HYCLATE 100 MG PO TABS
100.0000 mg | ORAL_TABLET | Freq: Once | ORAL | Status: AC
  Administered 2013-11-27: 100 mg via ORAL
  Filled 2013-11-26: qty 1

## 2013-11-26 MED ORDER — LORAZEPAM 2 MG/ML IJ SOLN
1.0000 mg | Freq: Once | INTRAMUSCULAR | Status: AC
  Administered 2013-11-26: 1 mg via INTRAVENOUS
  Filled 2013-11-26: qty 1

## 2013-11-26 MED ORDER — DEXTROSE 5 % IV SOLN
2.0000 g | Freq: Once | INTRAVENOUS | Status: AC
  Administered 2013-11-27: 2 g via INTRAVENOUS
  Filled 2013-11-26: qty 2

## 2013-11-26 MED ORDER — IOHEXOL 300 MG/ML  SOLN
80.0000 mL | Freq: Once | INTRAMUSCULAR | Status: AC | PRN
  Administered 2013-11-26: 80 mL via INTRAVENOUS

## 2013-11-26 MED ORDER — SODIUM CHLORIDE 0.9 % IV BOLUS (SEPSIS)
1000.0000 mL | Freq: Once | INTRAVENOUS | Status: AC
  Administered 2013-11-26: 1000 mL via INTRAVENOUS

## 2013-11-26 MED ORDER — IOHEXOL 300 MG/ML  SOLN: 20.0000 mL | INTRAMUSCULAR | Status: AC

## 2013-11-26 MED ORDER — FENTANYL CITRATE 0.05 MG/ML IJ SOLN: 50.0000 ug | INTRAMUSCULAR | Status: DC | PRN

## 2013-11-26 MED ORDER — FENTANYL CITRATE 0.05 MG/ML IJ SOLN
50.0000 ug | Freq: Once | INTRAMUSCULAR | Status: AC
  Administered 2013-11-26: 50 ug via INTRAVENOUS
  Filled 2013-11-26: qty 2

## 2013-11-26 NOTE — ED Notes (Signed)
CT called to inform of patient being finished with oral Contrast

## 2013-11-26 NOTE — ED Notes (Signed)
Pt. Has a hx of dementia and [facility]  Pt.s daughters noticed increased confusion yesterday.   Pt. Also having lower back pain, rt. Side abdominal pain and green vaginal discharge.  Pt. Is alert and denies any pain, Skin is pink , warm and dry.

## 2013-11-26 NOTE — ED Provider Notes (Signed)
CSN: [id number]     Arrival date & time 11/26/13  1803 History   First MD Initiated Contact with Patient 11/26/13 1821     Chief Complaint  Patient presents with  . Altered Mental Status     (Consider location/radiation/quality/duration/timing/severity/associated sxs/prior Treatment) HPI Comments: Patient is a 79 year old female with history of diabetes, hypertension, dementia, hypothyroidism who presents today with altered mental status. [facility] The history is provided by the daughters. The daughters report that she has been increasingly confused over the past 3-4 days. She is more agitated. Generally she is oriented to person and place. Additionally of concern is that she has had a green, malodorous vaginal discharge. The daughters are concerned that [facility] "[facility]" with her. She has been complaining of abdominal pain intermittently. No n/v/d.   The history is provided by a relative and the patient. No language interpreter was used.    Past Medical History  Diagnosis Date  . Diabetes mellitus without complication   . Hypertension   . Dementia   . Hypothyroidism   . Alzheimer disease    Past Surgical History  Procedure Laterality Date  . Pacemaker insertion    . Nephrectomy  1989   No family history on file. History  Substance Use Topics  . Smoking status: Never Smoker   . Smokeless tobacco: Never Used  . Alcohol Use: No   OB History   Grav Para Term Preterm Abortions TAB SAB Ect Mult Living                 Review of Systems  Unable to perform ROS: Dementia      Allergies  Hydrocodone-homatropine and Latex  Home Medications   Prior to Admission medications   Medication Sig Start Date End Date Taking? Authorizing Provider  acetaminophen (TYLENOL) 500 MG tablet Take 500 mg by mouth every 4 (four) hours as needed for pain (Max dose is 300 mg/24 hrs).    Historical Provider, MD  aspirin  EC 81 MG tablet Take 81 mg by mouth daily.    Historical Provider, MD  clonazePAM (KLONOPIN) 1 MG tablet Take 0.5 mg by mouth 3 (three) times daily as needed for anxiety.    Historical Provider, MD  donepezil (ARICEPT) 10 MG tablet Take 10 mg by mouth at bedtime.    Historical Provider, MD  guaiFENesin (ROBITUSSIN) 100 MG/5ML liquid Take 200 mg by mouth 4 (four) times daily as needed for cough.    Historical Provider, MD  HYDROcodone-acetaminophen (NORCO/VICODIN) 5-325 MG per tablet Take 1 tablet by mouth 3 (three) times daily as needed for pain.    Historical Provider, MD  insulin aspart (NOVOLOG) 100 UNIT/ML injection Inject 0-12 Units into the skin 2 (two) times daily. Sliding scale; 251-300=6units, 301-350=8units, 351-400=10units, 401-450=12units; >450 call MD    Historical Provider, MD  insulin glargine (LANTUS) 100 UNIT/ML injection Inject 16 Units into the skin at bedtime.     Historical Provider, MD  levothyroxine (SYNTHROID, LEVOTHROID) 100 MCG tablet Take 100 mcg by mouth every morning.     Historical Provider, MD  loperamide (IMODIUM) 2 MG capsule Take 2 mg by mouth 4 (four) times daily as needed for diarrhea or loose stools.    Historical Provider, MD  magnesium hydroxide (MILK OF MAGNESIA) 400 MG/5ML suspension Take 30 mLs by mouth at bedtime as needed for constipation.    Historical Provider, MD  memantine [facility](NAMENDA) 10  MG tablet Take 10 mg by mouth 2 (two) times daily.    Historical Provider, MD  Memantine HCl ER (NAMENDA XR) 28 MG CP24 Take 1 capsule by mouth daily.    Historical Provider, MD  metoprolol tartrate (LOPRESSOR) 25 MG tablet Take 25 mg by mouth 2 (two) times daily.    Historical Provider, MD  mirtazapine (REMERON) 15 MG tablet Take 15 mg by mouth at bedtime.    Historical Provider, MD  nitroGLYCERIN (NITROSTAT) 0.4 MG SL tablet Place 0.4 mg under the tongue every 5 (five) minutes x 3 doses as needed for chest pain.     Historical Provider, MD  omeprazole (PRILOSEC) 20 MG  capsule Take 40 mg by mouth every morning.    Historical Provider, MD  Rivaroxaban (XARELTO) 20 MG TABS Take 20 mg by mouth daily.     Historical Provider, MD  senna-docusate (SENOKOT-S) 8.6-50 MG per tablet Take 1 tablet by mouth 2 (two) times daily.    Historical Provider, MD  simethicone (MYLICON) 80 MG chewable tablet Chew 80 mg by mouth 3 (three) times daily.    Historical Provider, MD   BP 136/93  Pulse 77  Temp(Src) 97.7 F (36.5 C) (Oral)  Resp 18  SpO2 97% Physical Exam  Nursing note and vitals reviewed. Constitutional: She is oriented to person, place, and time. She appears well-developed and well-nourished. No distress.  Laying in bed and appears comfortable when not moved.   HENT:  Head: Normocephalic and atraumatic.  Right Ear: External ear normal.  Left Ear: External ear normal.  Nose: Nose normal.  Mouth/Throat: Oropharynx is clear and moist.  Eyes: Conjunctivae and EOM are normal. Pupils are equal, round, and reactive to light.  Neck: Normal range of motion.  Cardiovascular: Normal rate, regular rhythm and normal heart sounds.   Pulmonary/Chest: Effort normal and breath sounds normal. No stridor. No respiratory distress. She has no wheezes. She has no rales.  Abdominal: Soft. She exhibits no distension. There is generalized tenderness.  Genitourinary: Cervix exhibits discharge. Vaginal discharge found.  Unable to completely assess tenderness as patient screams throughout pelvic exam. Exam was chaperoned by tech and daughters.  Copious amount of yellow vaginal discharge.   Musculoskeletal: Normal range of motion.  Neurological: She is alert and oriented to person, place, and time. She has normal strength.  Skin: Skin is warm and dry. She is not diaphoretic. No erythema.  Psychiatric: She has a normal mood and affect. Her behavior is normal.    ED Course  Procedures (including critical care time) Labs Review Labs Reviewed  WET PREP, GENITAL - Abnormal; Notable for  the following:    Clue Cells Wet Prep HPF POC FEW (*)    WBC, Wet Prep HPF POC TOO NUMEROUS TO COUNT (*)    All other components within normal limits  CBC WITH DIFFERENTIAL - Abnormal; Notable for the following:    Hemoglobin 11.8 (*)    Platelets 136 (*)    Monocytes Absolute 1.1 (*)    All other components within normal limits  COMPREHENSIVE METABOLIC PANEL - Abnormal; Notable for the following:    Potassium 5.4 (*)    Glucose, Bld 151 (*)    Creatinine, Ser 1.24 (*)    Albumin 3.4 (*)    GFR calc non Af Amer 41 (*)    GFR calc Af Amer 48 (*)    All other components within normal limits  URINALYSIS, ROUTINE W REFLEX MICROSCOPIC - Abnormal; Notable for the following:  Specific Gravity, Urine 1.044 (*)    Ketones, ur 15 (*)    Urobilinogen, UA 2.0 (*)    All other components within normal limits  URINE CULTURE  GC/CHLAMYDIA PROBE AMP  CULTURE, BLOOD (ROUTINE X 2)  CULTURE, BLOOD (ROUTINE X 2)  LIPASE, BLOOD  I-STAT CG4 LACTIC ACID, ED  I-STAT TROPOININ, ED    Imaging Review Ct Head Wo Contrast  11/27/2013   CLINICAL DATA:  Severe headache.  EXAM: CT HEAD WITHOUT CONTRAST  TECHNIQUE: Contiguous axial images were obtained from the base of the skull through the vertex without intravenous contrast.  COMPARISON:  CT HEAD W/O CM dated 10/22/2012  FINDINGS: Diffuse cerebral atrophy. Mild ventricular dilatation consistent with central atrophy. Low-attenuation changes in the deep white matter consistent with small vessel ischemia. No mass effect or midline shift. No abnormal extra-axial fluid collections. Gray-white matter junctions are distinct. Basal cisterns are not effaced. No evidence of acute intracranial hemorrhage. No depressed skull fractures. Visualized paranasal sinuses and mastoid air cells are not opacified. Vascular calcifications.  IMPRESSION: No acute intracranial abnormalities. Chronic atrophy and small vessel ischemic changes. Similar appearance to previous study.    Electronically Signed   By: Burman Nieves M.D.   On: 11/27/2013 00:04   Ct Abdomen Pelvis W Contrast  11/27/2013   CLINICAL DATA:  Abdominal pain, altered mental status.  EXAM: CT ABDOMEN AND PELVIS WITH CONTRAST  TECHNIQUE: Multidetector CT imaging of the abdomen and pelvis was performed using the standard protocol following bolus administration of intravenous contrast.  CONTRAST:  80mL OMNIPAQUE IOHEXOL 300 MG/ML  SOLN  COMPARISON:  CT-ABD AND PELVIS W/O CONTRAST dated 11/03/2011  FINDINGS: Mild motion degraded examination.  Limited view of the lung bases demonstrates mild pleural thickening, dependent atelectasis. Heart size is mildly enlarged, pacer wires in place. Included pericardium is nonsuspicious. Remote right posterior tenth rib fracture.  The liver, spleen, right adrenal gland are unremarkable. Similar nodular thickening of the left adrenal gland. Atrophic pancreas. Gallbladder may be surgically absent.  Small hiatal hernia. Stomach, small and large bowel are normal in course and caliber without inflammatory changes. Sigmoid diverticulosis. Mild amount of retained large bowel stool. Limited assessment of the distal bowel and pelvis due to streak artifact from bilateral hip arthroplasties. No intraperitoneal free fluid nor free air.  Status post right nephrectomy. Compensatory left nephromegaly without hydronephrosis or nephrolithiasis. Too small to characterize hypodensities in the left kidney, in addition to multiple cysts, measuring up to mm in left lower pole. No perinephric collections. Bladder diverticulum. Limited evaluation of the bladder and internal reproductive organs due to streak artifact from bilateral hip arthroplasties. Aortoiliac vessels are normal course and caliber with mild calcific atherosclerosis.  Fatty replaced right rectus abdominus muscle. Osteopenia. Moderate degenerative change of the included thoracic spine.  IMPRESSION: Motion degraded examination without acute  intra-abdominal nor pelvic process.  Status post suspected cholecystectomy and right nephrectomy.   Electronically Signed   By: Awilda Metro   On: 11/27/2013 00:10     EKG Interpretation   Date/Time:  Sunday Nov 26 2013 19:14:31 EDT Ventricular Rate:  80 PR Interval:  188 QRS Duration: 80 QT Interval:  364 QTC Calculation: 420 R Axis:   -6 Text Interpretation:  Sinus rhythm Atrial premature complexes Borderline T  wave abnormalities Similar to prior Confirmed by Gwendolyn Grant  MD, BLAIR ([id number])  on 11/26/2013 7:25:51 PM      1:47 AM Discussed this case with Dr. Despina Hidden at [facility] who does not believe  her altered mental status has anything to do with her vaginal discharge. Dr. Despina Hidden recommends medicine admission and metrogel x 10 days.   MDM   Final diagnoses:  Acute encephalopathy  Hypothyroidism  Vaginal discharge   Patient presents to ED for altered mental status. Patient does have abdominal tenderness and CT abd/pelvis was done. Afebrile. Patient without elevated WBC or lactic acid. Labs are grossly unremarkable. Too numerous to count WBC on wet prep. Gc/chlaymida pending. Patient with prior hysterectomy, PID unlikely. Discussed this with Dr. Despina Hidden at [facility] who does not believe her vaginal discharge is related to her altered mental status. Patient will be admitted to medicine for further evaluation. Dr. Gwendolyn Grant evaluated patient and agrees with plan. Patient / Family / Caregiver informed of clinical course, understand medical decision-making process, and agree with plan.     Mora Bellman, PA-C 11/28/13 [phone]

## 2013-11-27 ENCOUNTER — Encounter (HOSPITAL_COMMUNITY): Payer: Self-pay | Admitting: Internal Medicine

## 2013-11-27 ENCOUNTER — Observation Stay (HOSPITAL_COMMUNITY): Payer: Medicare Other

## 2013-11-27 DIAGNOSIS — R4182 Altered mental status, unspecified: Secondary | ICD-10-CM | POA: Insufficient documentation

## 2013-11-27 DIAGNOSIS — N898 Other specified noninflammatory disorders of vagina: Secondary | ICD-10-CM

## 2013-11-27 DIAGNOSIS — G9340 Encephalopathy, unspecified: Secondary | ICD-10-CM

## 2013-11-27 DIAGNOSIS — E039 Hypothyroidism, unspecified: Secondary | ICD-10-CM

## 2013-11-27 LAB — COMPREHENSIVE METABOLIC PANEL
ALT: 8 U/L (ref 0–35)
AST: 15 U/L (ref 0–37)
Albumin: 3.1 g/dL — ABNORMAL LOW (ref 3.5–5.2)
Alkaline Phosphatase: 48 U/L (ref 39–117)
BILIRUBIN TOTAL: 0.5 mg/dL (ref 0.3–1.2)
BUN: 17 mg/dL (ref 6–23)
CALCIUM: 8.6 mg/dL (ref 8.4–10.5)
CHLORIDE: 107 meq/L (ref 96–112)
CO2: 18 meq/L — AB (ref 19–32)
Creatinine, Ser: 1.04 mg/dL (ref 0.50–1.10)
GFR calc non Af Amer: 51 mL/min — ABNORMAL LOW (ref >90)
GFR, EST AFRICAN AMERICAN: 59 mL/min — AB (ref >90)
GLUCOSE: 126 mg/dL — AB (ref 70–99)
Potassium: 3.9 mEq/L (ref 3.7–5.3)
SODIUM: 142 meq/L (ref 137–147)
Total Protein: 5.7 g/dL — ABNORMAL LOW (ref 6.0–8.3)

## 2013-11-27 LAB — URINALYSIS, ROUTINE W REFLEX MICROSCOPIC
BILIRUBIN URINE: NEGATIVE
Glucose, UA: NEGATIVE mg/dL
Hgb urine dipstick: NEGATIVE
Ketones, ur: 15 mg/dL — AB
Leukocytes, UA: NEGATIVE
NITRITE: NEGATIVE
Protein, ur: NEGATIVE mg/dL
SPECIFIC GRAVITY, URINE: 1.044 — AB (ref 1.005–1.030)
UROBILINOGEN UA: 2 mg/dL — AB (ref 0.0–1.0)
pH: 6.5 (ref 5.0–8.0)

## 2013-11-27 LAB — CBC WITH DIFFERENTIAL/PLATELET
BASOS ABS: 0 10*3/uL (ref 0.0–0.1)
Basophils Relative: 1 % (ref 0–1)
EOS ABS: 0.1 10*3/uL (ref 0.0–0.7)
Eosinophils Relative: 2 % (ref 0–5)
HCT: 32.1 % — ABNORMAL LOW (ref 36.0–46.0)
HEMOGLOBIN: 10.3 g/dL — AB (ref 12.0–15.0)
Lymphocytes Relative: 30 % (ref 12–46)
Lymphs Abs: 2.3 10*3/uL (ref 0.7–4.0)
MCH: 27.5 pg (ref 26.0–34.0)
MCHC: 32.1 g/dL (ref 30.0–36.0)
MCV: 85.6 fL (ref 78.0–100.0)
MONOS PCT: 14 % — AB (ref 3–12)
Monocytes Absolute: 1.1 10*3/uL — ABNORMAL HIGH (ref 0.1–1.0)
NEUTROS ABS: 4.2 10*3/uL (ref 1.7–7.7)
NEUTROS PCT: 54 % (ref 43–77)
Platelets: 114 10*3/uL — ABNORMAL LOW (ref 150–400)
RBC: 3.75 MIL/uL — ABNORMAL LOW (ref 3.87–5.11)
RDW: 14.8 % (ref 11.5–15.5)
WBC: 7.7 10*3/uL (ref 4.0–10.5)

## 2013-11-27 LAB — GLUCOSE, CAPILLARY
GLUCOSE-CAPILLARY: 135 mg/dL — AB (ref 70–99)
GLUCOSE-CAPILLARY: 157 mg/dL — AB (ref 70–99)
Glucose-Capillary: 116 mg/dL — ABNORMAL HIGH (ref 70–99)

## 2013-11-27 LAB — URINE CULTURE
Colony Count: NO GROWTH
Culture: NO GROWTH

## 2013-11-27 LAB — RAPID URINE DRUG SCREEN, HOSP PERFORMED
Amphetamines: NOT DETECTED
BENZODIAZEPINES: NOT DETECTED
Barbiturates: NOT DETECTED
Cocaine: NOT DETECTED
Opiates: NOT DETECTED
Tetrahydrocannabinol: NOT DETECTED

## 2013-11-27 LAB — GC/CHLAMYDIA PROBE AMP
CT PROBE, AMP APTIMA: NEGATIVE
GC PROBE AMP APTIMA: NEGATIVE

## 2013-11-27 LAB — AMMONIA: Ammonia: 14 umol/L (ref 11–60)

## 2013-11-27 LAB — VALPROIC ACID LEVEL: VALPROIC ACID LVL: 25.6 ug/mL — AB (ref 50.0–100.0)

## 2013-11-27 MED ORDER — ENOXAPARIN SODIUM 40 MG/0.4ML ~~LOC~~ SOLN
40.0000 mg | SUBCUTANEOUS | Status: DC
Start: 2013-11-27 — End: 2013-11-29
  Administered 2013-11-27 – 2013-11-28 (×2): 40 mg via SUBCUTANEOUS
  Filled 2013-11-27 (×3): qty 0.4

## 2013-11-27 MED ORDER — LEVOTHYROXINE SODIUM 125 MCG PO TABS: 125.0000 ug | ORAL_TABLET | Freq: Every day | ORAL | Status: DC

## 2013-11-27 MED ORDER — NITROGLYCERIN 0.4 MG SL SUBL: 0.4000 mg | SUBLINGUAL_TABLET | SUBLINGUAL | Status: DC | PRN

## 2013-11-27 MED ORDER — SIMETHICONE 80 MG PO CHEW
80.0000 mg | CHEWABLE_TABLET | Freq: Four times a day (QID) | ORAL | Status: DC | PRN
  Administered 2013-11-27: 80 mg via ORAL
  Filled 2013-11-27: qty 1

## 2013-11-27 MED ORDER — METRONIDAZOLE IN NACL 5-0.79 MG/ML-% IV SOLN
500.0000 mg | Freq: Three times a day (TID) | INTRAVENOUS | Status: DC
  Administered 2013-11-27 – 2013-11-28 (×4): 500 mg via INTRAVENOUS
  Filled 2013-11-27 (×7): qty 100

## 2013-11-27 MED ORDER — GUAIFENESIN 100 MG/5ML PO SOLN
200.0000 mg | Freq: Four times a day (QID) | ORAL | Status: DC | PRN
  Filled 2013-11-27: qty 10

## 2013-11-27 MED ORDER — SIMETHICONE 80 MG PO CHEW
80.0000 mg | CHEWABLE_TABLET | Freq: Three times a day (TID) | ORAL | Status: DC
  Administered 2013-11-27 – 2013-11-28 (×4): 80 mg via ORAL
  Filled 2013-11-27 (×9): qty 1

## 2013-11-27 MED ORDER — MIRTAZAPINE 15 MG PO TABS
15.0000 mg | ORAL_TABLET | Freq: Every day | ORAL | Status: DC
  Administered 2013-11-27 – 2013-11-28 (×2): 15 mg via ORAL
  Filled 2013-11-27 (×3): qty 1

## 2013-11-27 MED ORDER — SENNOSIDES-DOCUSATE SODIUM 8.6-50 MG PO TABS: 1.0000 | ORAL_TABLET | Freq: Two times a day (BID) | ORAL | Status: DC

## 2013-11-27 MED ORDER — PANTOPRAZOLE SODIUM 40 MG PO TBEC
40.0000 mg | DELAYED_RELEASE_TABLET | Freq: Every day | ORAL | Status: DC
  Administered 2013-11-27 – 2013-11-28 (×2): 40 mg via ORAL
  Filled 2013-11-27 (×2): qty 1

## 2013-11-27 MED ORDER — SENNOSIDES-DOCUSATE SODIUM 8.6-50 MG PO TABS
1.0000 | ORAL_TABLET | Freq: Every day | ORAL | Status: DC
  Administered 2013-11-27 – 2013-11-28 (×2): 1 via ORAL
  Filled 2013-11-27 (×2): qty 1

## 2013-11-27 MED ORDER — METRONIDAZOLE 0.75 % EX GEL
Freq: Every day | CUTANEOUS | Status: DC
  Filled 2013-11-27: qty 45

## 2013-11-27 MED ORDER — INSULIN ASPART 100 UNIT/ML ~~LOC~~ SOLN
0.0000 [IU] | Freq: Three times a day (TID) | SUBCUTANEOUS | Status: DC
  Administered 2013-11-27 – 2013-11-28 (×2): 1 [IU] via SUBCUTANEOUS

## 2013-11-27 MED ORDER — SENNOSIDES-DOCUSATE SODIUM 8.6-50 MG PO TABS
1.0000 | ORAL_TABLET | Freq: Every evening | ORAL | Status: DC | PRN
  Filled 2013-11-27 (×2): qty 1

## 2013-11-27 MED ORDER — MORPHINE SULFATE 2 MG/ML IJ SOLN
1.0000 mg | INTRAMUSCULAR | Status: DC | PRN
  Administered 2013-11-27 (×2): 1 mg via INTRAVENOUS
  Filled 2013-11-27 (×2): qty 1

## 2013-11-27 MED ORDER — ASPIRIN 325 MG PO TABS
325.0000 mg | ORAL_TABLET | Freq: Every day | ORAL | Status: DC
  Administered 2013-11-27 – 2013-11-28 (×2): 325 mg via ORAL
  Filled 2013-11-27 (×3): qty 1

## 2013-11-27 MED ORDER — LEVOTHYROXINE SODIUM 25 MCG PO TABS: 12.5000 ug | ORAL_TABLET | Freq: Every day | ORAL | Status: DC

## 2013-11-27 MED ORDER — METRONIDAZOLE 0.75 % VA GEL
1.0000 | Freq: Every day | VAGINAL | Status: DC
  Administered 2013-11-27 – 2013-11-28 (×2): 1 via VAGINAL
  Filled 2013-11-27: qty 70

## 2013-11-27 MED ORDER — DIVALPROEX SODIUM 125 MG PO DR TAB
125.0000 mg | DELAYED_RELEASE_TABLET | Freq: Two times a day (BID) | ORAL | Status: DC
  Administered 2013-11-27 – 2013-11-28 (×4): 125 mg via ORAL
  Filled 2013-11-27 (×6): qty 1

## 2013-11-27 MED ORDER — HYDROCODONE-ACETAMINOPHEN 5-325 MG PO TABS
1.0000 | ORAL_TABLET | Freq: Three times a day (TID) | ORAL | Status: DC | PRN
  Filled 2013-11-27: qty 1

## 2013-11-27 MED ORDER — SODIUM CHLORIDE 0.45 % IV SOLN
INTRAVENOUS | Status: AC
  Administered 2013-11-27 (×2): via INTRAVENOUS

## 2013-11-27 MED ORDER — TRAZODONE HCL 50 MG PO TABS
50.0000 mg | ORAL_TABLET | Freq: Every day | ORAL | Status: DC
  Administered 2013-11-27 – 2013-11-28 (×2): 50 mg via ORAL
  Filled 2013-11-27 (×3): qty 1

## 2013-11-27 MED ORDER — CLONAZEPAM 0.5 MG PO TABS: 0.5000 mg | ORAL_TABLET | Freq: Three times a day (TID) | ORAL | Status: DC | PRN

## 2013-11-27 MED ORDER — LEVOTHYROXINE SODIUM 25 MCG PO TABS
137.5000 ug | ORAL_TABLET | Freq: Every day | ORAL | Status: DC
  Administered 2013-11-27 – 2013-11-28 (×2): 137.5 ug via ORAL
  Filled 2013-11-27 (×3): qty 0.5

## 2013-11-27 MED ORDER — METOPROLOL TARTRATE 25 MG PO TABS
25.0000 mg | ORAL_TABLET | Freq: Two times a day (BID) | ORAL | Status: DC
  Administered 2013-11-27 – 2013-11-28 (×4): 25 mg via ORAL
  Filled 2013-11-27 (×6): qty 1

## 2013-11-27 MED ORDER — CIPROFLOXACIN IN D5W 400 MG/200ML IV SOLN
400.0000 mg | Freq: Two times a day (BID) | INTRAVENOUS | Status: DC
  Administered 2013-11-27 – 2013-11-28 (×2): 400 mg via INTRAVENOUS
  Filled 2013-11-27 (×5): qty 200

## 2013-11-27 MED ORDER — LORAZEPAM 2 MG/ML IJ SOLN
1.0000 mg | Freq: Once | INTRAMUSCULAR | Status: AC
  Administered 2013-11-27: 1 mg via INTRAVENOUS
  Filled 2013-11-27: qty 1

## 2013-11-27 MED ORDER — INSULIN GLARGINE 100 UNIT/ML ~~LOC~~ SOLN
16.0000 [IU] | Freq: Every day | SUBCUTANEOUS | Status: DC
  Administered 2013-11-27 – 2013-11-28 (×2): 16 [IU] via SUBCUTANEOUS
  Filled 2013-11-27 (×3): qty 0.16

## 2013-11-27 NOTE — Progress Notes (Signed)
79yo female brought in by daughter 2/2 increased confusion, back pain, abdominal pain, and green vaginal discharge, CT of abdomen shows no acute process, to begin IV ABX for suspected intra-abdominal infection.  Will start Cipro 400mg  IV Q12H for CrCl ~35 ml/min and monitor CBC, CrCl, Cx.  Vernard GamblesVeronda Brunilda Eble, PharmD, BCPS 11/27/2013 5:18 AM

## 2013-11-27 NOTE — Progress Notes (Signed)
10:34 AM I agree with HPI/GPe and A/P per Dr. Toniann FailKakrakandy      79 y/o female, known h/o Advanced dementia [on Hospice for this], multiple admissions for Toxic metabolic encephalopathy and confusion-related to Pyelonephritis in 11/2012, h/o Breast ca s/p bilateral mastectomy, H/o recurrent LE DVT's, h/o ty 2 DM, Htn, Hypothyroidism  Not opening eyes but says 'hello No apparent distress.  Famiyl at bedside reportin increasing confusion-Depakote was started ~ now  For increasing agitation but was cut back as patient more somnolent.  Had greenish vaginal d/c and severe back pain and was brought in for this.   Patient Active Problem List   Diagnosis Date Noted  . Altered mental status 11/27/2013  . Acute encephalopathy 11/27/2013  . Vaginal discharge 11/27/2013  . Cellulitis 11/27/2012  . Sepsis 11/25/2012  . Atrial fibrillation 11/25/2012  . Encephalopathy acute 11/23/2012  . UTI (urinary tract infection) 11/23/2012  . Dementia 09/17/2012  . DM2 (diabetes mellitus, type 2) 09/17/2012  . Hypertension 09/17/2012  . Hypothyroidism 09/17/2012  . Chest pain 09/17/2012  . Cardiac pacemaker 09/17/2012    Cont' Rx for Gardnerella with flagyl. Continue empiric cipro/flagyl until Bc prelim back-will d.c Abx if neg.  Ammonia level 14, NO anion gap so unlikely to be 2/2 to Depakote toxicity Monitor for resolution back pain and vag discharge. Hospice Rn to be alerted patient here  Pleas KochJai Trace Wirick, MD [facility] [phone]([phone]

## 2013-11-27 NOTE — Progress Notes (Addendum)
Inpatient RN visit- Sarah PalingBetty Mc Yang [facility] Room 07 -[facility] RN Visit-Sarah Merilynn FinlandRobertson RN  Non [facility] of [facility]'s disease. Pt is DNR code. OOF DNR in place in pt home/facility. Pt lying in bed with eyes closed, snoring soundly, appears comfortable. Daughter Sarah Yang present at bedside. Sarah Yang related the events that led up to her mother being [facility] yesterday. She stated that her mother "just wasn't feeling well, had abdominal pain and vaginal discharge had gotten worse". Sarah Yang stated that she was "worried because she only has one kidney".  Emotional support offered. Per chart review pt is taking scheduled oral medications, did discuss with Sarah Yang the possibility of transition to oral flagyl on discharge.   Patient's home medication list and transfer summary in place on shadow chart.   Please call [facility] @ [phone]- with any hospice needs.   Thank you. Hansel StarlingKaren E. Robertson, RN  [facility]  Hospice Liaison  [phone])

## 2013-11-27 NOTE — Evaluation (Signed)
Clinical/Bedside Swallow Evaluation Patient Details  Name: Sarah Yang MRN: [id number] Date of Birth: Nov 17, 1934  Today's Date: 11/27/2013 Time: [id number] SLP Time Calculation (min): 18 min  Past Medical History:  Past Medical History  Diagnosis Date  . Diabetes mellitus without complication   . Hypertension   . Dementia   . Hypothyroidism   . Alzheimer disease    Past Surgical History:  Past Surgical History  Procedure Laterality Date  . Pacemaker insertion    . Nephrectomy  1989  . Breast surgery     HPI:  79 y/o female, known h/o Advanced dementia on Hospice for this, multiple admissions for Toxic metabolic encephalopathy and confusion-related to Pyelonephritis in 11/2012, h/o Breast ca s/p bilateral mastectomy, H/o recurrent LE DVT's, h/o ty 2 DM, Htn, Hypothyroidism. Pt was brought in for increased confusion and vaginal discharge.   Assessment / Plan / Recommendation Clinical Impression  Pt presents with suspected delayed swallow initiation with immediate cough after taking several large, consecutive straw sips of thin liquids, but with no other overt s/s of aspiration across challenging. Pt was easily aroused for PO trials and maintained alertness throughout intake, requiring Mod cues for sustained attention. She did exhibit prolonged mastication and bolus formation, particularly with Dys 3 textures. Frequent eructation was noted throughout intake, which daughter reports is typical. Recommend Dys 2 textures and thin liquids with full supervision for aspiration and esophageal precautions.    Aspiration Risk  Moderate    Diet Recommendation Dysphagia 2 (Fine chop);Thin liquid   Liquid Administration via: Cup;Straw Medication Administration: Crushed with puree Supervision: Staff to assist with self feeding;Full supervision/cueing for compensatory strategies Compensations: Slow rate;Small sips/bites;Follow solids with liquid Postural Changes and/or Swallow Maneuvers:  Upright 30-60 min after meal;Seated upright 90 degrees    Other  Recommendations Oral Care Recommendations: Oral care BID   Follow Up Recommendations  Skilled Nursing facility    Frequency and Duration min 2x/week  2 weeks   Pertinent Vitals/Pain N/A    SLP Swallow Goals     Swallow Study Prior Functional Status       General Date of Onset: 11/26/13 HPI: 79 y/o female, known h/o Advanced dementia on Hospice for this, multiple admissions for Toxic metabolic encephalopathy and confusion-related to Pyelonephritis in 11/2012, h/o Breast ca s/p bilateral mastectomy, H/o recurrent LE DVT's, h/o ty 2 DM, Htn, Hypothyroidism. Pt was brought in for increased confusion and vaginal discharge. Type of Study: Bedside swallow evaluation Previous Swallow Assessment: none in chart Diet Prior to this Study: NPO Temperature Spikes Noted: No Respiratory Status: Room air History of Recent Intubation: No Behavior/Cognition: Alert;Cooperative;Pleasant mood;Confused;Requires cueing Self-Feeding Abilities: Total assist Patient Positioning: Upright in bed Baseline Vocal Quality: Clear    Oral/Motor/Sensory Function     Ice Chips Ice chips: Not tested   Thin Liquid Thin Liquid: Impaired Presentation: Cup;Straw Oral Phase Functional Implications: Oral holding Pharyngeal  Phase Impairments: Suspected delayed Swallow;Cough - Immediate    Nectar Thick Nectar Thick Liquid: Not tested   Honey Thick Honey Thick Liquid: Not tested   Puree Puree: Impaired Presentation: Self Fed;Spoon Oral Phase Functional Implications: Other (comment) (mildly prolonged bolus prep) Pharyngeal Phase Impairments: Suspected delayed Swallow   Solid   GO    Solid: Impaired Oral Phase Impairments: Impaired mastication Oral Phase Functional Implications: Other (comment) (prolonged bolus formation) Pharyngeal Phase Impairments: Suspected delayed Swallow       Sarah Yang, M.A. CCC-SLP [phone]  Sarah HamLaura  Yang 11/27/2013,3:08 PM

## 2013-11-27 NOTE — H&P (Addendum)
[facility] History and Physical  Sarah DodrillBetty Lou Kenyon ZOX:[id number] DOB: 10/07/1934 DOA: 11/26/2013  Referring physician: ER physician. PCP: Pcp Not In System  Chief Complaint: Altered mental status.  History of pain from ER physician and patient's daughter as patient is demented.  HPI: Sarah Yang is a 79 y.o. female with history of dementia, atrial fibrillation presently only on aspirin, diabetes mellitus, hypothyroidism was brought to the ER after patient was found to be increasingly confused over the last 2-3 days. Patient also has been complaining of abdominal pain and was found vaginal discharge. ED physician examined and found the patient had copious vaginal discharge which was foul-smelling. CT abdomen and pelvis did not show any acute findings. Patient as per the family has had hysterectomy. Wet prep shows clue cells. As per ED physician, ED physician had discussed with on-call gynecologist who at this time is recommended IV Flagyl and since patient has had hysterectomy patient did not have PID. Patient otherwise is not in acute distress and as per the family patient has not had any nausea vomiting diarrhea shortness of breath productive cough. Patient also was complaining of back pain but on exam back does not show any skin excoriations and does not have any localized tenderness. Patient is presently afebrile. Labs does not show any leukocytosis. UA is unremarkable. Chest x-ray is pending. Patient has been admitted for further observation and management of her mental status changes. Patient presently is not following commands which patient's daughter states that she usually does. Depakote levels and ammonia levels have been low. Patient's daughter states that some of her medications was recently started last month for agitation.   Review of Systems: As presented in the history of presenting illness, rest negative.  Past Medical History  Diagnosis Date  . Diabetes mellitus  without complication   . Hypertension   . Dementia   . Hypothyroidism   . Alzheimer disease    Past Surgical History  Procedure Laterality Date  . Pacemaker insertion    . Nephrectomy  1989  . Breast surgery     Social History:  reports that she has never smoked. She has never used smokeless tobacco. She reports that she does not drink alcohol or use illicit drugs. Where does patient live nursing home. Can patient participate in ADLs? No.  Allergies  Allergen Reactions  . Hydrocodone-Homatropine     Unknown  . Latex Rash    blisters    Family History:  Family History  Problem Relation Age of Onset  . Breast cancer Daughter       Prior to Admission medications   Medication Sig Start Date End Date Taking? Authorizing Provider  acetaminophen (TYLENOL) 500 MG tablet Take 500 mg by mouth every 4 (four) hours as needed for pain (Max dose is 300 mg/24 hrs).   Yes Historical Provider, MD  aspirin 325 MG tablet Take 325 mg by mouth daily.   Yes Historical Provider, MD  clonazePAM (KLONOPIN) 1 MG tablet Take 0.5 mg by mouth 3 (three) times daily as needed for anxiety.   Yes Historical Provider, MD  divalproex (DEPAKOTE) 125 MG DR tablet Take 125 mg by mouth 2 (two) times daily.   Yes Historical Provider, MD  guaiFENesin (ROBITUSSIN) 100 MG/5ML liquid Take 200 mg by mouth 4 (four) times daily as needed for cough.   Yes Historical Provider, MD  HYDROcodone-acetaminophen (NORCO/VICODIN) 5-325 MG per tablet Take 1 tablet by mouth 3 (three) times daily as needed for pain.   Yes  Historical Provider, MD  insulin glargine (LANTUS) 100 UNIT/ML injection Inject 16 Units into the skin at bedtime.    Yes Historical Provider, MD  levothyroxine (SYNTHROID, LEVOTHROID) 125 MCG tablet Take 125 mcg by mouth daily before breakfast.   Yes Historical Provider, MD  levothyroxine (SYNTHROID, LEVOTHROID) 25 MCG tablet Take 12.5 mcg by mouth daily before breakfast. Take with 125 mcg to equal 137.5 mcg   Yes  Historical Provider, MD  loperamide (IMODIUM) 2 MG capsule Take 2 mg by mouth 4 (four) times daily as needed for diarrhea or loose stools.   Yes Historical Provider, MD  magnesium hydroxide (MILK OF MAGNESIA) 400 MG/5ML suspension Take 30 mLs by mouth at bedtime as needed for constipation.   Yes Historical Provider, MD  metoprolol tartrate (LOPRESSOR) 25 MG tablet Take 25 mg by mouth 2 (two) times daily.   Yes Historical Provider, MD  mirtazapine (REMERON) 15 MG tablet Take 15 mg by mouth at bedtime.   Yes Historical Provider, MD  omeprazole (PRILOSEC) 20 MG capsule Take 40 mg by mouth every morning.   Yes Historical Provider, MD  omeprazole (PRILOSEC) 20 MG capsule Take 20 mg by mouth daily.   Yes Historical Provider, MD  senna-docusate (SENOKOT-S) 8.6-50 MG per tablet Take 1 tablet by mouth 2 (two) times daily.   Yes Historical Provider, MD  sennosides-docusate sodium (SENOKOT-S) 8.6-50 MG tablet Take 1 tablet by mouth daily.   Yes Historical Provider, MD  simethicone (MYLICON) 80 MG chewable tablet Chew 80 mg by mouth 3 (three) times daily.   Yes Historical Provider, MD  simethicone (MYLICON) 80 MG chewable tablet Chew 80 mg by mouth every 6 (six) hours as needed for flatulence.   Yes Historical Provider, MD  traZODone (DESYREL) 50 MG tablet Take 50 mg by mouth at bedtime.   Yes Historical Provider, MD  Vitamin D, Ergocalciferol, (DRISDOL) 50000 UNITS CAPS capsule Take 50,000 Units by mouth every 7 (seven) days. monday   Yes Historical Provider, MD  nitroGLYCERIN (NITROSTAT) 0.4 MG SL tablet Place 0.4 mg under the tongue every 5 (five) minutes x 3 doses as needed for chest pain.     Historical Provider, MD    Physical Exam: Filed Vitals:   11/26/13 2234 11/27/13 0030 11/27/13 0121 11/27/13 0229  BP: 131/69 140/61    Pulse: 78 81    Temp:   98.8 F (37.1 C) 97.9 F (36.6 C)  TempSrc:   Rectal   Resp: 16 19    SpO2: 93% 97%       General:  Well-developed well-nourished.  Eyes:  Anicteric no pallor.  ENT: No discharge from the ears eyes nose mouth.  Neck: No mass felt no neck rigidity.  Cardiovascular: S1-S2 heard.  Respiratory: No rhonchi or crepitations.  Abdomen: Soft nontender bowel sounds present. No guarding or rigidity.  Skin: No rash.  Musculoskeletal: No edema.  Psychiatric: Patient is demented and does not follow commands.  Neurologic: Moves all extremities.  Labs on Admission:  Basic Metabolic Panel:  Recent Labs Lab 11/26/13 1904  NA 146  K 5.4*  CL 106  CO2 22  GLUCOSE 151*  BUN 23  CREATININE 1.24*  CALCIUM 9.2   Liver Function Tests:  Recent Labs Lab 11/26/13 1904  AST 27  ALT 10  ALKPHOS 53  BILITOT 0.5  PROT 6.6  ALBUMIN 3.4*    Recent Labs Lab 11/26/13 1904  LIPASE 14    Recent Labs Lab 11/27/13 0210  AMMONIA 14   CBC:  Recent Labs Lab 11/26/13 1904  WBC 8.9  NEUTROABS 6.3  HGB 11.8*  HCT 37.1  MCV 86.5  PLT 136*   Cardiac Enzymes: No results found for this basename: CKTOTAL, CKMB, CKMBINDEX, TROPONINI,  in the last 168 hours  BNP (last 3 results) No results found for this basename: PROBNP,  in the last 8760 hours CBG: No results found for this basename: GLUCAP,  in the last 168 hours  Radiological Exams on Admission: Ct Head Wo Contrast  11/27/2013   CLINICAL DATA:  Severe headache.  EXAM: CT HEAD WITHOUT CONTRAST  TECHNIQUE: Contiguous axial images were obtained from the base of the skull through the vertex without intravenous contrast.  COMPARISON:  CT HEAD W/O CM dated 10/22/2012  FINDINGS: Diffuse cerebral atrophy. Mild ventricular dilatation consistent with central atrophy. Low-attenuation changes in the deep white matter consistent with small vessel ischemia. No mass effect or midline shift. No abnormal extra-axial fluid collections. Gray-white matter junctions are distinct. Basal cisterns are not effaced. No evidence of acute intracranial hemorrhage. No depressed skull fractures.  Visualized paranasal sinuses and mastoid air cells are not opacified. Vascular calcifications.  IMPRESSION: No acute intracranial abnormalities. Chronic atrophy and small vessel ischemic changes. Similar appearance to previous study.   Electronically Signed   By: Burman NievesWilliam  Stevens M.D.   On: 11/27/2013 00:04   Ct Abdomen Pelvis W Contrast  11/27/2013   CLINICAL DATA:  Abdominal pain, altered mental status.  EXAM: CT ABDOMEN AND PELVIS WITH CONTRAST  TECHNIQUE: Multidetector CT imaging of the abdomen and pelvis was performed using the standard protocol following bolus administration of intravenous contrast.  CONTRAST:  80mL OMNIPAQUE IOHEXOL 300 MG/ML  SOLN  COMPARISON:  CT-ABD AND PELVIS W/O CONTRAST dated 11/03/2011  FINDINGS: Mild motion degraded examination.  Limited view of the lung bases demonstrates mild pleural thickening, dependent atelectasis. Heart size is mildly enlarged, pacer wires in place. Included pericardium is nonsuspicious. Remote right posterior tenth rib fracture.  The liver, spleen, right adrenal gland are unremarkable. Similar nodular thickening of the left adrenal gland. Atrophic pancreas. Gallbladder may be surgically absent.  Small hiatal hernia. Stomach, small and large bowel are normal in course and caliber without inflammatory changes. Sigmoid diverticulosis. Mild amount of retained large bowel stool. Limited assessment of the distal bowel and pelvis due to streak artifact from bilateral hip arthroplasties. No intraperitoneal free fluid nor free air.  Status post right nephrectomy. Compensatory left nephromegaly without hydronephrosis or nephrolithiasis. Too small to characterize hypodensities in the left kidney, in addition to multiple cysts, measuring up to mm in left lower pole. No perinephric collections. Bladder diverticulum. Limited evaluation of the bladder and internal reproductive organs due to streak artifact from bilateral hip arthroplasties. Aortoiliac vessels are normal  course and caliber with mild calcific atherosclerosis.  Fatty replaced right rectus abdominus muscle. Osteopenia. Moderate degenerative change of the included thoracic spine.  IMPRESSION: Motion degraded examination without acute intra-abdominal nor pelvic process.  Status post suspected cholecystectomy and right nephrectomy.   Electronically Signed   By: Awilda Metroourtnay  Bloomer   On: 11/27/2013 00:10   Dg Chest Port 1 View  11/27/2013   CLINICAL DATA:  Shortness of Breath  EXAM: PORTABLE CHEST - 1 VIEW  COMPARISON:  DG CHEST 1V PORT dated 11/25/2012  FINDINGS: Shallow inspiration. Stable appearance of cardiac pacemaker. Old fracture deformity of the right proximal humerus. The heart size and mediastinal contours are within normal limits. Both lungs are clear.  IMPRESSION: No active disease.   Electronically  Signed   By: Burman Nieves M.D.   On: 11/27/2013 02:50     Assessment/Plan Principal Problem:   Acute encephalopathy Active Problems:   Dementia   DM2 (diabetes mellitus, type 2)   Hypertension   Hypothyroidism   Vaginal discharge   1. Acute encephalopathy in a patient with dementia - possibilities include infectious cause from abdomen given vaginal discharge or stroke or from metabolic reasons including dehydration.. At this time I have continued patient on Cipro and Flagyl. Patient is also getting metronidazole vaginal gel and once this placed IV Flagyl may be discontinued. Patient has positive for Gardenerella vaginalis.Chlamydia and Gonorrhea probes are pending.Check blood cultures. If patient's mental status does not improve repeat CT head in 24 hours as MRI cannot be done due to pacemaker. 2. Possible dehydration - I have placed patient on half-normal saline. Closely follow metabolic panel and intake output. 3. Anemia and thrombocytopenia - closely follow CBC and if there is any further worsening may be related to infectious process and need further workup. 4. Diabetes mellitus - continue  home medications with sliding-scale coverage. 5. Hypothyroidism - check TSH continue Synthroid. 6. Atrial fibrillation status post pacemaker placement - presently rate controlled. Continue present medications. Not on anticoagulation. On aspirin. 7. Dementia - continue present medications.  Addendum - I had discussed with on-call gynecologist Dr. Despina Hidden about patient's continuous discharge and pain from the vagina. Dr. Despina Hidden has advised to continue Flagyl vaginal gel.   Code Status: DO NOT RESUSCITATE. Patient is on hospice for dementia. Family Communication: Patient's daughter.  Disposition Plan: Admit to inpatient.    Eduard Clos [facility] Pager [phone].  If 7PM-7AM, please contact night-coverage [email or web address] Password [email or web address] 11/27/2013, 3:05 AM

## 2013-11-27 NOTE — ED Notes (Signed)
Spoke with Flow about patient's bed assignment.

## 2013-11-28 LAB — COMPREHENSIVE METABOLIC PANEL
ALBUMIN: 2.9 g/dL — AB (ref 3.5–5.2)
ALT: 8 U/L (ref 0–35)
AST: 13 U/L (ref 0–37)
Alkaline Phosphatase: 50 U/L (ref 39–117)
BILIRUBIN TOTAL: 0.3 mg/dL (ref 0.3–1.2)
BUN: 10 mg/dL (ref 6–23)
CHLORIDE: 110 meq/L (ref 96–112)
CO2: 20 mEq/L (ref 19–32)
CREATININE: 1.03 mg/dL (ref 0.50–1.10)
Calcium: 8.7 mg/dL (ref 8.4–10.5)
GFR calc Af Amer: 60 mL/min — ABNORMAL LOW (ref >90)
GFR, EST NON AFRICAN AMERICAN: 51 mL/min — AB (ref >90)
Glucose, Bld: 120 mg/dL — ABNORMAL HIGH (ref 70–99)
Potassium: 4 mEq/L (ref 3.7–5.3)
Sodium: 142 mEq/L (ref 137–147)
Total Protein: 5.9 g/dL — ABNORMAL LOW (ref 6.0–8.3)

## 2013-11-28 LAB — GLUCOSE, CAPILLARY
GLUCOSE-CAPILLARY: 110 mg/dL — AB (ref 70–99)
Glucose-Capillary: 137 mg/dL — ABNORMAL HIGH (ref 70–99)
Glucose-Capillary: 110 mg/dL — ABNORMAL HIGH (ref 70–99)
Glucose-Capillary: 145 mg/dL — ABNORMAL HIGH (ref 70–99)

## 2013-11-28 LAB — CBC
HCT: 34.2 % — ABNORMAL LOW (ref 36.0–46.0)
Hemoglobin: 11.1 g/dL — ABNORMAL LOW (ref 12.0–15.0)
MCH: 27.4 pg (ref 26.0–34.0)
MCHC: 32.5 g/dL (ref 30.0–36.0)
MCV: 84.4 fL (ref 78.0–100.0)
PLATELETS: 141 10*3/uL — AB (ref 150–400)
RBC: 4.05 MIL/uL (ref 3.87–5.11)
RDW: 14.9 % (ref 11.5–15.5)
WBC: 8 10*3/uL (ref 4.0–10.5)

## 2013-11-28 LAB — PROTIME-INR
INR: 1.15 (ref 0.00–1.49)
PROTHROMBIN TIME: 14.5 s (ref 11.6–15.2)

## 2013-11-28 MED ORDER — PNEUMOCOCCAL VAC POLYVALENT 25 MCG/0.5ML IJ INJ
0.5000 mL | INJECTION | INTRAMUSCULAR | Status: DC
  Filled 2013-11-28: qty 0.5

## 2013-11-28 MED ORDER — METRONIDAZOLE 500 MG PO TABS
500.0000 mg | ORAL_TABLET | Freq: Three times a day (TID) | ORAL | Status: DC
  Administered 2013-11-28 – 2013-11-29 (×3): 500 mg via ORAL
  Filled 2013-11-28 (×6): qty 1

## 2013-11-28 MED ORDER — LEVOTHYROXINE SODIUM 150 MCG PO TABS
150.0000 ug | ORAL_TABLET | Freq: Every day | ORAL | Status: DC
  Filled 2013-11-28 (×2): qty 1

## 2013-11-28 NOTE — Progress Notes (Signed)
Note: This document was prepared with digital dictation and possible smart phrase technology. Any transcriptional errors that result from this process are unintentional.   Sarah DodrillBetty Lou Yang ZOX:[id number] DOB: 1934/12/10 DOA: 11/26/2013 PCP: Pcp Not In System  Brief narrative: 79 y/o female, known h/o Advanced dementia [on Hospice for this], multiple admissions for Toxic metabolic encephalopathy and confusion-related to Pyelonephritis in 11/2012, h/o Breast ca s/p bilateral mastectomy, H/o recurrent LE DVT's, h/o ty 2 DM, Htn, Hypothyroidism admitted 11/27/13 with toxic metabolic encephalopathy suspected to be secondary to pyelonephritis  Past medical history-As per Problem list Chart reviewed as below-reviewed Reviewed  Consultants:  Hospice RN  Procedures:  None  Antibiotics:  None   Subjective  More awake alert, and Saturday Thursday, thinks that this was a different city other than increased from More engaging than recently. No spontaneous complaints otherwise. Nursing reports no issues.   Objective    Interim History:   Telemetry: None   Objective: Filed Vitals:   11/27/13 2055 11/28/13 0439 11/28/13 1046 11/28/13 1437  BP: 137/70 147/73 147/73 128/69  Pulse: 77 80  57  Temp: 98 F (36.7 C) 98 F (36.7 C)  98.7 F (37.1 C)  TempSrc: Oral Oral  Oral  Resp: 14 16  16   SpO2: 99% 97%  99%    Intake/Output Summary (Last 24 hours) at 11/28/13 1626 Last data filed at 11/28/13 1211  Gross per 24 hour  Intake 2083.75 ml  Output      0 ml  Net 2083.75 ml    Exam:  General: Still disoriented but seems to be close to baseline Cardiovascular: S1-S2 no murmur or gallop Respiratory: Clinically clear no added sounds Abdomen: Soft nontender no rebound no guarding Skin no lower extremity edema Neuro intact confused  Data Reviewed: Basic Metabolic Panel:  Recent Labs Lab 11/26/13 1904 11/27/13 0527 11/28/13 0511  NA 146 142 142  K 5.4* 3.9 4.0  CL 106  107 110  CO2 22 18* 20  GLUCOSE 151* 126* 120*  BUN 23 17 10   CREATININE 1.24* 1.04 1.03  CALCIUM 9.2 8.6 8.7   Liver Function Tests:  Recent Labs Lab 11/26/13 1904 11/27/13 0527 11/28/13 0511  AST 27 15 13   ALT 10 8 8   ALKPHOS 53 48 50  BILITOT 0.5 0.5 0.3  PROT 6.6 5.7* 5.9*  ALBUMIN 3.4* 3.1* 2.9*    Recent Labs Lab 11/26/13 1904  LIPASE 14    Recent Labs Lab 11/27/13 0210  AMMONIA 14   CBC:  Recent Labs Lab 11/26/13 1904 11/27/13 0527 11/28/13 0511  WBC 8.9 7.7 8.0  NEUTROABS 6.3 4.2  --   HGB 11.8* 10.3* 11.1*  HCT 37.1 32.1* 34.2*  MCV 86.5 85.6 84.4  PLT 136* 114* 141*   Cardiac Enzymes: No results found for this basename: CKTOTAL, CKMB, CKMBINDEX, TROPONINI,  in the last 168 hours BNP: No components found with this basename: POCBNP,  CBG:  Recent Labs Lab 11/27/13 1120 11/27/13 1601 11/27/13 2119 11/28/13 0623 11/28/13 1121  GLUCAP 116* 135* 157* 137* 110*    Recent Results (from the past 240 hour(s))  WET PREP, GENITAL     Status: Abnormal   Collection Time    11/26/13  9:09 PM      Result Value Ref Range Status   Yeast Wet Prep HPF POC NONE SEEN  NONE SEEN Final   Trich, Wet Prep NONE SEEN  NONE SEEN Final   Clue Cells Wet Prep HPF POC FEW (*) NONE  SEEN Final   WBC, Wet Prep HPF POC TOO NUMEROUS TO COUNT (*) NONE SEEN Final  CULTURE, BLOOD (ROUTINE X 2)     Status: None   Collection Time    11/26/13 11:01 PM      Result Value Ref Range Status   Specimen Description BLOOD LEFT ARM   Final   Special Requests BOTTLES DRAWN AEROBIC AND ANAEROBIC 5CC EACH   Final   Culture  Setup Time     Final   Value: 11/27/2013 03:49     Performed at [facility]   Culture     Final   Value:        BLOOD CULTURE RECEIVED NO GROWTH TO DATE CULTURE WILL BE HELD FOR 5 DAYS BEFORE ISSUING A FINAL NEGATIVE REPORT     Performed at [facility]   Report Status PENDING   Incomplete  CULTURE, BLOOD (ROUTINE X 2)     Status: None    Collection Time    11/26/13 11:05 PM      Result Value Ref Range Status   Specimen Description BLOOD LEFT FOREARM   Final   Special Requests BOTTLES DRAWN AEROBIC ONLY 5CC   Final   Culture  Setup Time     Final   Value: 11/27/2013 03:49     Performed at [facility]   Culture     Final   Value:        BLOOD CULTURE RECEIVED NO GROWTH TO DATE CULTURE WILL BE HELD FOR 5 DAYS BEFORE ISSUING A FINAL NEGATIVE REPORT     Performed at [facility]   Report Status PENDING   Incomplete  URINE CULTURE     Status: None   Collection Time    11/27/13  1:00 AM      Result Value Ref Range Status   Specimen Description URINE, CATHETERIZED   Final   Special Requests NONE   Final   Culture  Setup Time     Final   Value: 11/27/2013 01:32     Performed at [facility] Count     Final   Value: NO GROWTH     Performed at [facility]   Culture     Final   Value: NO GROWTH     Performed at [facility]   Report Status 11/27/2013 FINAL   Final  GC/CHLAMYDIA PROBE AMP     Status: None   Collection Time    11/27/13  1:00 AM      Result Value Ref Range Status   CT Probe RNA NEGATIVE  NEGATIVE Final   GC Probe RNA NEGATIVE  NEGATIVE Final   Comment: (NOTE)                                                                                               **Normal Reference Range: Negative**          Assay performed using the Gen-Probe APTIMA COMBO2 (R) Assay.     Acceptable specimen types for this assay include APTIMA Swabs (Unisex,  endocervical, urethral, or vaginal), first void urine, and ThinPrep     liquid based cytology samples.     Performed at [facility]     Studies:              All Imaging reviewed and is as per above notation   Scheduled Meds: . aspirin  325 mg Oral Daily  . divalproex  125 mg Oral BID  . enoxaparin (LOVENOX) injection  40 mg Subcutaneous Q24H  . insulin aspart  0-9 Units Subcutaneous TID WC  . insulin  glargine  16 Units Subcutaneous QHS  . levothyroxine  137.5 mcg Oral QAC breakfast  . metoprolol tartrate  25 mg Oral BID  . metroNIDAZOLE  500 mg Oral 3 times per day  . metroNIDAZOLE  1 Applicatorful Vaginal QHS  . mirtazapine  15 mg Oral QHS  . pantoprazole  40 mg Oral Daily  . [START ON 11/29/2013] pneumococcal 23 valent vaccine  0.5 mL Intramuscular Tomorrow-1000  . senna-docusate  1 tablet Oral Daily  . simethicone  80 mg Oral TID  . traZODone  50 mg Oral QHS   Continuous Infusions:    Assessment/Plan:  1. Toxic metabolic encephalopathy-unclear etiology.  Could be vaginitis vs. excessive narcotics.  We will transition patient to oral Flagyl to complete 5 days more of treatment stop it 5/17. Her antibiotics have been discontinued. Ammonia level was low and there was no anion gap on admission-in addition valproic acid level was low therefore would just keep dosage at the same level as we are not treating seizure behavioral disturbance 2. Vaginitis-see above.  Back pain seems better therefore I will discontinue IV morphine at present time as this may cloud her sensorium 3. History breast cancer status post bilateral mastectomy-stable at present 4. History recurrent DVTs-stable 5. Hypothyroidism continue levothyroxine 137 every morning-patient's TSH was initially 5.4 so it may be that patient slightly hypothyroid and he can increase his Synthroid to 150 mcg cautiously 6. Diabetes mellitus type II-blood sugars 116 to 157-continue current dose of Lantus 16 units as well as sliding scale coverage 7. Hypertension-continue metoprolol 25 twice a day, relatively stable 8. Probable Microcytic anemia with mild thrombocytopenia-stable 9. Dementia with behavioral disturbances continue valproic acid at current dose 125 twice a day, continue trazodone 50 each bedtime 10. GERD-continue pantoprazole 40 each bedtime  Code Status: Full Family Communication: Daughter Doran ClayStanley,Teresa Daughter  [phone] Disposition Plan: PT/OT to determine if suitable for therapy.   Pleas KochJai Jameia Makris, MD  [facility] Pager [phone] 11/28/2013, 4:26 PM    LOS: 2 days

## 2013-11-28 NOTE — Progress Notes (Signed)
SLP Cancellation Note  Patient Details Name: Sarah Yang MRN: [id number] DOB: [birth year]   Cancelled treatment:       Reason Eval/Treat Not Completed: Fatigue/lethargy limiting ability to participate. Pt asleep upon SLP arrival and would respond when her name was called, however could not maintain alertness sufficiently for safe PO intake. Will return as schedule allows.   [name], M.A. CCC-SLP [phone]  [name] 11/28/2013, 9:36 AM

## 2013-11-28 NOTE — Progress Notes (Signed)
Patient was resting comfortably in bed. No pain or discomfort noted. SW consulted with nurse, Sarah Yang who advised that patient will be fed lunch. Patient is also more alert and verbal today. SW spoke to patient's daughter Sarah Yang prior to visit. She expressed concern regarding patient's level of care and questioned if patient needed to be in a SNF verses current AL/memory care setting. She will discuss with her family to determine best course of action. This is a non-related admission. Please contact [facility], [phone] for any questions or changes in patient's condition.  Sarah Lonon,LCSW [phone] cell)

## 2013-11-29 LAB — GLUCOSE, CAPILLARY
Glucose-Capillary: 118 mg/dL — ABNORMAL HIGH (ref 70–99)
Glucose-Capillary: 160 mg/dL — ABNORMAL HIGH (ref 70–99)

## 2013-11-29 MED ORDER — METRONIDAZOLE 500 MG PO TABS: 500.0000 mg | ORAL_TABLET | Freq: Three times a day (TID) | ORAL | Status: AC

## 2013-11-29 MED ORDER — LEVOTHYROXINE SODIUM 150 MCG PO TABS
150.0000 ug | ORAL_TABLET | Freq: Every day | ORAL | Status: AC
Start: 2013-11-29 — End: ?

## 2013-11-29 MED ORDER — LEVOFLOXACIN 500 MG PO TABS: 500.0000 mg | ORAL_TABLET | Freq: Every day | ORAL | Status: AC

## 2013-11-29 NOTE — Progress Notes (Signed)
Clinical Social Work Department BRIEF PSYCHOSOCIAL ASSESSMENT 11/29/2013  Patient:  Sarah Yang,Sarah Yang     Account Number:  [id number]     Admit date:  11/26/2013  Clinical Social Worker:  Harless NakayamaAMBELAL,Rebbecca Osuna, LCSWA  Date/Time:  11/29/2013 01:45 PM  Referred by:  Physician  Date Referred:  11/29/2013 Referred for  ALF Placement   Other Referral:   Interview type:  Family Other interview type:   Spoke with pt daughter on the phone    PSYCHOSOCIAL DATA Living Status:  FACILITY [facility] Level of care:  Assisted Living Primary support name:  Sarah Yang [phone] Primary support relationship to patient:  CHILD, ADULT Degree of support available:   Pt has good family support    CURRENT CONCERNS Current Concerns  Post-Acute Placement   Other Concerns:    SOCIAL WORK ASSESSMENT / PLAN CSW informed pt was admitted from ALF and is ready to return. CSW called pt daughter Sarah Yang and completed assessment. Per pt daughter, [facility] and plan will be for pt to return. Pt daughter informed CSW she was unaware pt was being discharged today but she had no concerns at this time. Pt daughter requesting that CSW or pt nurse call to inform when pt is being transported to facility so she can meet pt there. CSW informed pt daughter that this message would be passed along. CSW called facility and left voicemail informing that pt was ready to return. CSW also faxed clinicals to number provided by facility receptionist [phone]). CSW awaiting return phone call to confirm pt can return before arranging non-emergent ambulance.   Assessment/plan status:  Psychosocial Support/Ongoing Assessment of Needs Other assessment/ plan:   Information/referral to community resources:   None needed at this time    PATIENT'S/FAMILY'S RESPONSE TO PLAN OF CARE: Pt family is agreeable to pt returning to ALF       [employer] BlockPoonum Shahan Yang,  LCSWA [phone]

## 2013-11-29 NOTE — Progress Notes (Signed)
Inpatient RN visit-Andrienne Adin [facility]07  -[facility] RN Visit-Karen Merilynn FinlandRobertson RN  Non related non covered admission to [facility] diagnosis of Alzheimers Disease. Pt is DNR  code. OOF DNR form in place on shadow chart. Pt lying in bed with eyes closed, easily awakened by gentle tactile stimuli and voice. Pt denied pain, no nonverbal s/s pain noted. Lunch tray at bedside, staff aide in to feed pt lunch.  Per chart review [facility]. [facility] team aware of d/c.  Patient's home medication list and transfer summary in place on shadow chart.   Please call [facility] @ [phone] with any hospice needs.   Thank you. Hansel StarlingKaren E. Robertson, RN  [facility]  Hospice Liaison  [phone])

## 2013-11-29 NOTE — Progress Notes (Signed)
Physical Therapy Note  Physical Therapy order acknowledged. Noted that pt is to d/c back to long term care facility today. Spoke with hospice RN who reports pt is at a wheelchair level and unable to propel herself. This is her baseline and therefore no acute physical therapy services are indicated at this time. PT is signing-off. Thank you for this referral.  Charlsie MerlesLogan Secor Natsha Guidry, PT [phone]

## 2013-11-29 NOTE — Progress Notes (Signed)
CSW (Clinical Child psychotherapistocial Worker) received call from facility confirming pt can return today. CSW prepared pt dc packet and placed with shadow chart. CSW arranged non-emergent ambulance transport. Facility and pt nurse informed. Pt nurse asked to please call pt daughter when transport arrives so she can meet pt at facility. Pt nurse agreed. CSW signing off.  Rikayla Demmon, LCSWA [phone]

## 2013-11-29 NOTE — Discharge Summary (Signed)
Physician Discharge Summary  Sarah Yang ZOX:[id number] DOB: Aug 26, 1934 DOA: 11/26/2013  PCP: Pcp Not In System  Admit date: 11/26/2013 Discharge date: 11/29/2013  Time spent: 45 minutes  Recommendations for Outpatient Follow-up:  1. Recommend de-escalation of care 2. Recommend Hospice involvement as poor likely outcome 3. Recommend continuation of discussion of goals of care as OP  4. Complete flagyl po 12/01/13 5. Rpt TSH in 3 weeks  Discharge Diagnoses:  Principal Problem:   Acute encephalopathy Active Problems:   Dementia   DM2 (diabetes mellitus, type 2)   Hypertension   Hypothyroidism   Vaginal discharge   Discharge Condition: guarded and poor long term  Diet recommendation:  Dysphagia 2, thin liquid  There were no vitals filed for this visit.  History of present illness:  79 y/o female, known h/o Advanced dementia [on Hospice for this], multiple admissions for Toxic metabolic encephalopathy and confusion-related to Pyelonephritis in 11/2012, h/o Breast ca s/p bilateral mastectomy, H/o recurrent LE DVT's, h/o ty 2 DM, Htn, Hypothyroidism admitted 11/27/13 with toxic metabolic encephalopathy suspected to be secondary to pyelonephritis Also noted this admission to be somnolent and sleepy most of the time so Aspiration Pneumonia was suspected as well  Hospital Course:   1. Toxic metabolic encephalopathy- 2/2 to subclinical aspiration.  We will transition patient to oral Flagyl to complete 5 days more of treatment stop it 5/17. She was transitioned to Levaquin on discharge to cover subclinical aspiration pneumonitis. Ammonia level was low and there was no anion gap on admission-in addition valproic acid level was low therefore would just keep dosage at the same level as we are not treating seizure behavioral disturbance 2. Vaginitis-see above. Back pain seems better therefore I will discontinue IV morphine at present time as this may cloud her sensorium 3. History breast  cancer status post bilateral mastectomy-stable at present 4. History recurrent DVTs-stable 5. Hypothyroidism continue levothyroxine 137 every morning-patient's TSH was initially 5.4 so it may be that patient slightly hypothyroid and he can increase his Synthroid to 150 mcg cautiously 6. Diabetes mellitus type II-blood sugars 116 to 157-continue current dose of Lantus 16 units as well as sliding scale coverage-susp[ect if she continue to not eat or is sleepy, would need to cut back doses.  In hospital her sugar was 118-160 7. Hypertension-continue metoprolol 25 twice a day, relatively stable 8. Mild TCP-could be related to Abx vs infection.  Would not work up further 9. Probable Microcytic anemia with mild thrombocytopenia-stable 10. Dementia with behavioral disturbances continue valproic acid at current dose 125 twice a day, continue trazodone 50 each bedtime 11. GERD-continue pantoprazole 40 each bedtime   Procedures:  cxr  Consultations:  HospicwRN  Discharge Exam: Filed Vitals:   11/29/13 0521  BP: 137/62  Pulse: 71  Temp: 98.7 F (37.1 C)  Resp: 16    General: sleepy.  Awakens, no distress Cardiovascular: s1 s2 no m/r/g Respiratory: clear no added sound LE's soft  Discharge Instructions You were cared for by a hospitalist during your hospital stay. If you have any questions about your discharge medications or the care you received while you were in the hospital after you are discharged, you can call the unit and asked to speak with the hospitalist on call if the hospitalist that took care of you is not available. Once you are discharged, your primary care physician will handle any further medical issues. Please note that NO REFILLS for any discharge medications will be authorized once you are discharged, as it  is imperative that you return to your primary care physician (or establish a relationship with a primary care physician if you do not have one) for your aftercare needs  so that they can reassess your need for medications and monitor your lab values.  Discharge Orders   Future Orders Complete By Expires   Diet - low sodium heart healthy  As directed    Increase activity slowly  As directed        Medication List    STOP taking these medications       Vitamin D (Ergocalciferol) 50000 UNITS Caps capsule  Commonly known as:  DRISDOL      TAKE these medications       acetaminophen 500 MG tablet  Commonly known as:  TYLENOL  Take 500 mg by mouth every 4 (four) hours as needed for pain (Max dose is 300 mg/24 hrs).     aspirin 325 MG tablet  Take 325 mg by mouth daily.     clonazePAM 1 MG tablet  Commonly known as:  KLONOPIN  Take 0.5 mg by mouth 3 (three) times daily as needed for anxiety.     divalproex 125 MG DR tablet  Commonly known as:  DEPAKOTE  Take 125 mg by mouth 2 (two) times daily.     guaiFENesin 100 MG/5ML liquid  Commonly known as:  ROBITUSSIN  Take 200 mg by mouth 4 (four) times daily as needed for cough.     HYDROcodone-acetaminophen 5-325 MG per tablet  Commonly known as:  NORCO/VICODIN  Take 1 tablet by mouth 3 (three) times daily as needed for pain.     insulin glargine 100 UNIT/ML injection  Commonly known as:  LANTUS  Inject 16 Units into the skin at bedtime.     levothyroxine 125 MCG tablet  Commonly known as:  SYNTHROID, LEVOTHROID  Take 125 mcg by mouth daily before breakfast.     levothyroxine 150 MCG tablet  Commonly known as:  SYNTHROID, LEVOTHROID  Take 1 tablet (150 mcg total) by mouth daily before breakfast.     loperamide 2 MG capsule  Commonly known as:  IMODIUM  Take 2 mg by mouth 4 (four) times daily as needed for diarrhea or loose stools.     magnesium hydroxide 400 MG/5ML suspension  Commonly known as:  MILK OF MAGNESIA  Take 30 mLs by mouth at bedtime as needed for constipation.     metoprolol tartrate 25 MG tablet  Commonly known as:  LOPRESSOR  Take 25 mg by mouth 2 (two) times daily.      metroNIDAZOLE 500 MG tablet  Commonly known as:  FLAGYL  Take 1 tablet (500 mg total) by mouth every 8 (eight) hours.     mirtazapine 15 MG tablet  Commonly known as:  REMERON  Take 15 mg by mouth at bedtime.     nitroGLYCERIN 0.4 MG SL tablet  Commonly known as:  NITROSTAT  Place 0.4 mg under the tongue every 5 (five) minutes x 3 doses as needed for chest pain.     omeprazole 20 MG capsule  Commonly known as:  PRILOSEC  Take 40 mg by mouth every morning.     sennosides-docusate sodium 8.6-50 MG tablet  Commonly known as:  SENOKOT-S  Take 1 tablet by mouth daily.     senna-docusate 8.6-50 MG per tablet  Commonly known as:  Senokot-S  Take 1 tablet by mouth 2 (two) times daily.     simethicone 80 MG chewable tablet  Commonly known as:  MYLICON  Chew 80 mg by mouth every 6 (six) hours as needed for flatulence.     simethicone 80 MG chewable tablet  Commonly known as:  MYLICON  Chew 80 mg by mouth 3 (three) times daily.     traZODone 50 MG tablet  Commonly known as:  DESYREL  Take 50 mg by mouth at bedtime.       Allergies  Allergen Reactions  . Hydrocodone-Homatropine     Unknown (takes Vicodin at home)  . Latex Rash    blisters      The results of significant diagnostics from this hospitalization (including imaging, microbiology, ancillary and laboratory) are listed below for reference.    Significant Diagnostic Studies: Ct Head Wo Contrast  11/27/2013   CLINICAL DATA:  Severe headache.  EXAM: CT HEAD WITHOUT CONTRAST  TECHNIQUE: Contiguous axial images were obtained from the base of the skull through the vertex without intravenous contrast.  COMPARISON:  CT HEAD W/O CM dated 10/22/2012  FINDINGS: Diffuse cerebral atrophy. Mild ventricular dilatation consistent with central atrophy. Low-attenuation changes in the deep white matter consistent with small vessel ischemia. No mass effect or midline shift. No abnormal extra-axial fluid collections. Gray-white matter  junctions are distinct. Basal cisterns are not effaced. No evidence of acute intracranial hemorrhage. No depressed skull fractures. Visualized paranasal sinuses and mastoid air cells are not opacified. Vascular calcifications.  IMPRESSION: No acute intracranial abnormalities. Chronic atrophy and small vessel ischemic changes. Similar appearance to previous study.   Electronically Signed   By: Burman Nieves M.D.   On: 11/27/2013 00:04   Ct Abdomen Pelvis W Contrast  11/27/2013   CLINICAL DATA:  Abdominal pain, altered mental status.  EXAM: CT ABDOMEN AND PELVIS WITH CONTRAST  TECHNIQUE: Multidetector CT imaging of the abdomen and pelvis was performed using the standard protocol following bolus administration of intravenous contrast.  CONTRAST:  80mL OMNIPAQUE IOHEXOL 300 MG/ML  SOLN  COMPARISON:  CT-ABD AND PELVIS W/O CONTRAST dated 11/03/2011  FINDINGS: Mild motion degraded examination.  Limited view of the lung bases demonstrates mild pleural thickening, dependent atelectasis. Heart size is mildly enlarged, pacer wires in place. Included pericardium is nonsuspicious. Remote right posterior tenth rib fracture.  The liver, spleen, right adrenal gland are unremarkable. Similar nodular thickening of the left adrenal gland. Atrophic pancreas. Gallbladder may be surgically absent.  Small hiatal hernia. Stomach, small and large bowel are normal in course and caliber without inflammatory changes. Sigmoid diverticulosis. Mild amount of retained large bowel stool. Limited assessment of the distal bowel and pelvis due to streak artifact from bilateral hip arthroplasties. No intraperitoneal free fluid nor free air.  Status post right nephrectomy. Compensatory left nephromegaly without hydronephrosis or nephrolithiasis. Too small to characterize hypodensities in the left kidney, in addition to multiple cysts, measuring up to mm in left lower pole. No perinephric collections. Bladder diverticulum. Limited evaluation of the  bladder and internal reproductive organs due to streak artifact from bilateral hip arthroplasties. Aortoiliac vessels are normal course and caliber with mild calcific atherosclerosis.  Fatty replaced right rectus abdominus muscle. Osteopenia. Moderate degenerative change of the included thoracic spine.  IMPRESSION: Motion degraded examination without acute intra-abdominal nor pelvic process.  Status post suspected cholecystectomy and right nephrectomy.   Electronically Signed   By: Awilda Metro   On: 11/27/2013 00:10   Dg Chest Port 1 View  11/27/2013   CLINICAL DATA:  Shortness of Breath  EXAM: PORTABLE CHEST - 1 VIEW  COMPARISON:  DG CHEST 1V PORT dated 11/25/2012  FINDINGS: Shallow inspiration. Stable appearance of cardiac pacemaker. Old fracture deformity of the right proximal humerus. The heart size and mediastinal contours are within normal limits. Both lungs are clear.  IMPRESSION: No active disease.   Electronically Signed   By: Burman NievesWilliam  Stevens M.D.   On: 11/27/2013 02:50    Microbiology: Recent Results (from the past 240 hour(s))  WET PREP, GENITAL     Status: Abnormal   Collection Time    11/26/13  9:09 PM      Result Value Ref Range Status   Yeast Wet Prep HPF POC NONE SEEN  NONE SEEN Final   Trich, Wet Prep NONE SEEN  NONE SEEN Final   Clue Cells Wet Prep HPF POC FEW (*) NONE SEEN Final   WBC, Wet Prep HPF POC TOO NUMEROUS TO COUNT (*) NONE SEEN Final  CULTURE, BLOOD (ROUTINE X 2)     Status: None   Collection Time    11/26/13 11:01 PM      Result Value Ref Range Status   Specimen Description BLOOD LEFT ARM   Final   Special Requests BOTTLES DRAWN AEROBIC AND ANAEROBIC 5CC EACH   Final   Culture  Setup Time     Final   Value: 11/27/2013 03:49     Performed at [facility]   Culture     Final   Value:        BLOOD CULTURE RECEIVED NO GROWTH TO DATE CULTURE WILL BE HELD FOR 5 DAYS BEFORE ISSUING A FINAL NEGATIVE REPORT     Performed at [facility]   Report  Status PENDING   Incomplete  CULTURE, BLOOD (ROUTINE X 2)     Status: None   Collection Time    11/26/13 11:05 PM      Result Value Ref Range Status   Specimen Description BLOOD LEFT FOREARM   Final   Special Requests BOTTLES DRAWN AEROBIC ONLY 5CC   Final   Culture  Setup Time     Final   Value: 11/27/2013 03:49     Performed at [facility]   Culture     Final   Value:        BLOOD CULTURE RECEIVED NO GROWTH TO DATE CULTURE WILL BE HELD FOR 5 DAYS BEFORE ISSUING A FINAL NEGATIVE REPORT     Performed at [facility]   Report Status PENDING   Incomplete  URINE CULTURE     Status: None   Collection Time    11/27/13  1:00 AM      Result Value Ref Range Status   Specimen Description URINE, CATHETERIZED   Final   Special Requests NONE   Final   Culture  Setup Time     Final   Value: 11/27/2013 01:32     Performed at [facility]   Colony Count     Final   Value: NO GROWTH     Performed at [facility]   Culture     Final   Value: NO GROWTH     Performed at [facility]   Report Status 11/27/2013 FINAL   Final  GC/CHLAMYDIA PROBE AMP     Status: None   Collection Time    11/27/13  1:00 AM      Result Value Ref Range Status   CT Probe RNA NEGATIVE  NEGATIVE Final   GC Probe RNA NEGATIVE  NEGATIVE Final  Comment: (NOTE)                                                                                               **Normal Reference Range: Negative**          Assay performed using the Gen-Probe APTIMA COMBO2 (R) Assay.     Acceptable specimen types for this assay include APTIMA Swabs (Unisex,     endocervical, urethral, or vaginal), first void urine, and ThinPrep     liquid based cytology samples.     Performed at [facility]     Labs: Basic Metabolic Panel:  Recent Labs Lab 11/26/13 1904 11/27/13 0527 11/28/13 0511  NA 146 142 142  K 5.4* 3.9 4.0  CL 106 107 110  CO2 22 18* 20  GLUCOSE 151* 126* 120*  BUN 23 17 10    CREATININE 1.24* 1.04 1.03  CALCIUM 9.2 8.6 8.7   Liver Function Tests:  Recent Labs Lab 11/26/13 1904 11/27/13 0527 11/28/13 0511  AST 27 15 13   ALT 10 8 8   ALKPHOS 53 48 50  BILITOT 0.5 0.5 0.3  PROT 6.6 5.7* 5.9*  ALBUMIN 3.4* 3.1* 2.9*    Recent Labs Lab 11/26/13 1904  LIPASE 14    Recent Labs Lab 11/27/13 0210  AMMONIA 14   CBC:  Recent Labs Lab 11/26/13 1904 11/27/13 0527 11/28/13 0511  WBC 8.9 7.7 8.0  NEUTROABS 6.3 4.2  --   HGB 11.8* 10.3* 11.1*  HCT 37.1 32.1* 34.2*  MCV 86.5 85.6 84.4  PLT 136* 114* 141*   Cardiac Enzymes: No results found for this basename: CKTOTAL, CKMB, CKMBINDEX, TROPONINI,  in the last 168 hours BNP: BNP (last 3 results) No results found for this basename: PROBNP,  in the last 8760 hours CBG:  Recent Labs Lab 11/28/13 1121 11/28/13 1656 11/28/13 2154 11/29/13 0637 11/29/13 1118  GLUCAP 110* 110* 145* 118* 160*       Signed:  Jai-Gurmukh Cindel Daugherty  [facility] 11/29/2013, 12:22 PM

## 2013-11-30 NOTE — ED Provider Notes (Signed)
Medical screening examination/treatment/procedure(s) were conducted as a shared visit with non-physician practitioner(s) and myself.  I personally evaluated the patient during the encounter.   EKG Interpretation   Date/Time:  Sunday Nov 26 2013 19:14:31 EDT Ventricular Rate:  80 PR Interval:  188 QRS Duration: 80 QT Interval:  364 QTC Calculation: 420 R Axis:   -6 Text Interpretation:  Sinus rhythm Atrial premature complexes Borderline T  wave abnormalities Similar to prior Confirmed by Gwendolyn GrantWALDEN  MD, Ziona Wickens ([id number])  on 11/26/2013 7:25:51 PM      Patient here with altered mental status. Elderly female with dementia. Unable to cooperate with exam. Patient's workup unrevealing, concern for possible vaginal infection. Patient given antibiotics, admitted.   Dagmar HaitWilliam Lukus Binion, MD 11/30/13 [phone]

## 2013-12-03 LAB — CULTURE, BLOOD (ROUTINE X 2)
CULTURE: NO GROWTH
Culture: NO GROWTH

## 2013-12-18 DEATH — deceased

## 2014-02-09 IMAGING — CR DG CHEST 2V
2 series · 2 of 2 positions shown · non-contrast
Comparison: Chest radiograph 10/24/2012

CLINICAL DATA: Weakness

CHEST - 2 VIEW

[w chest lat]
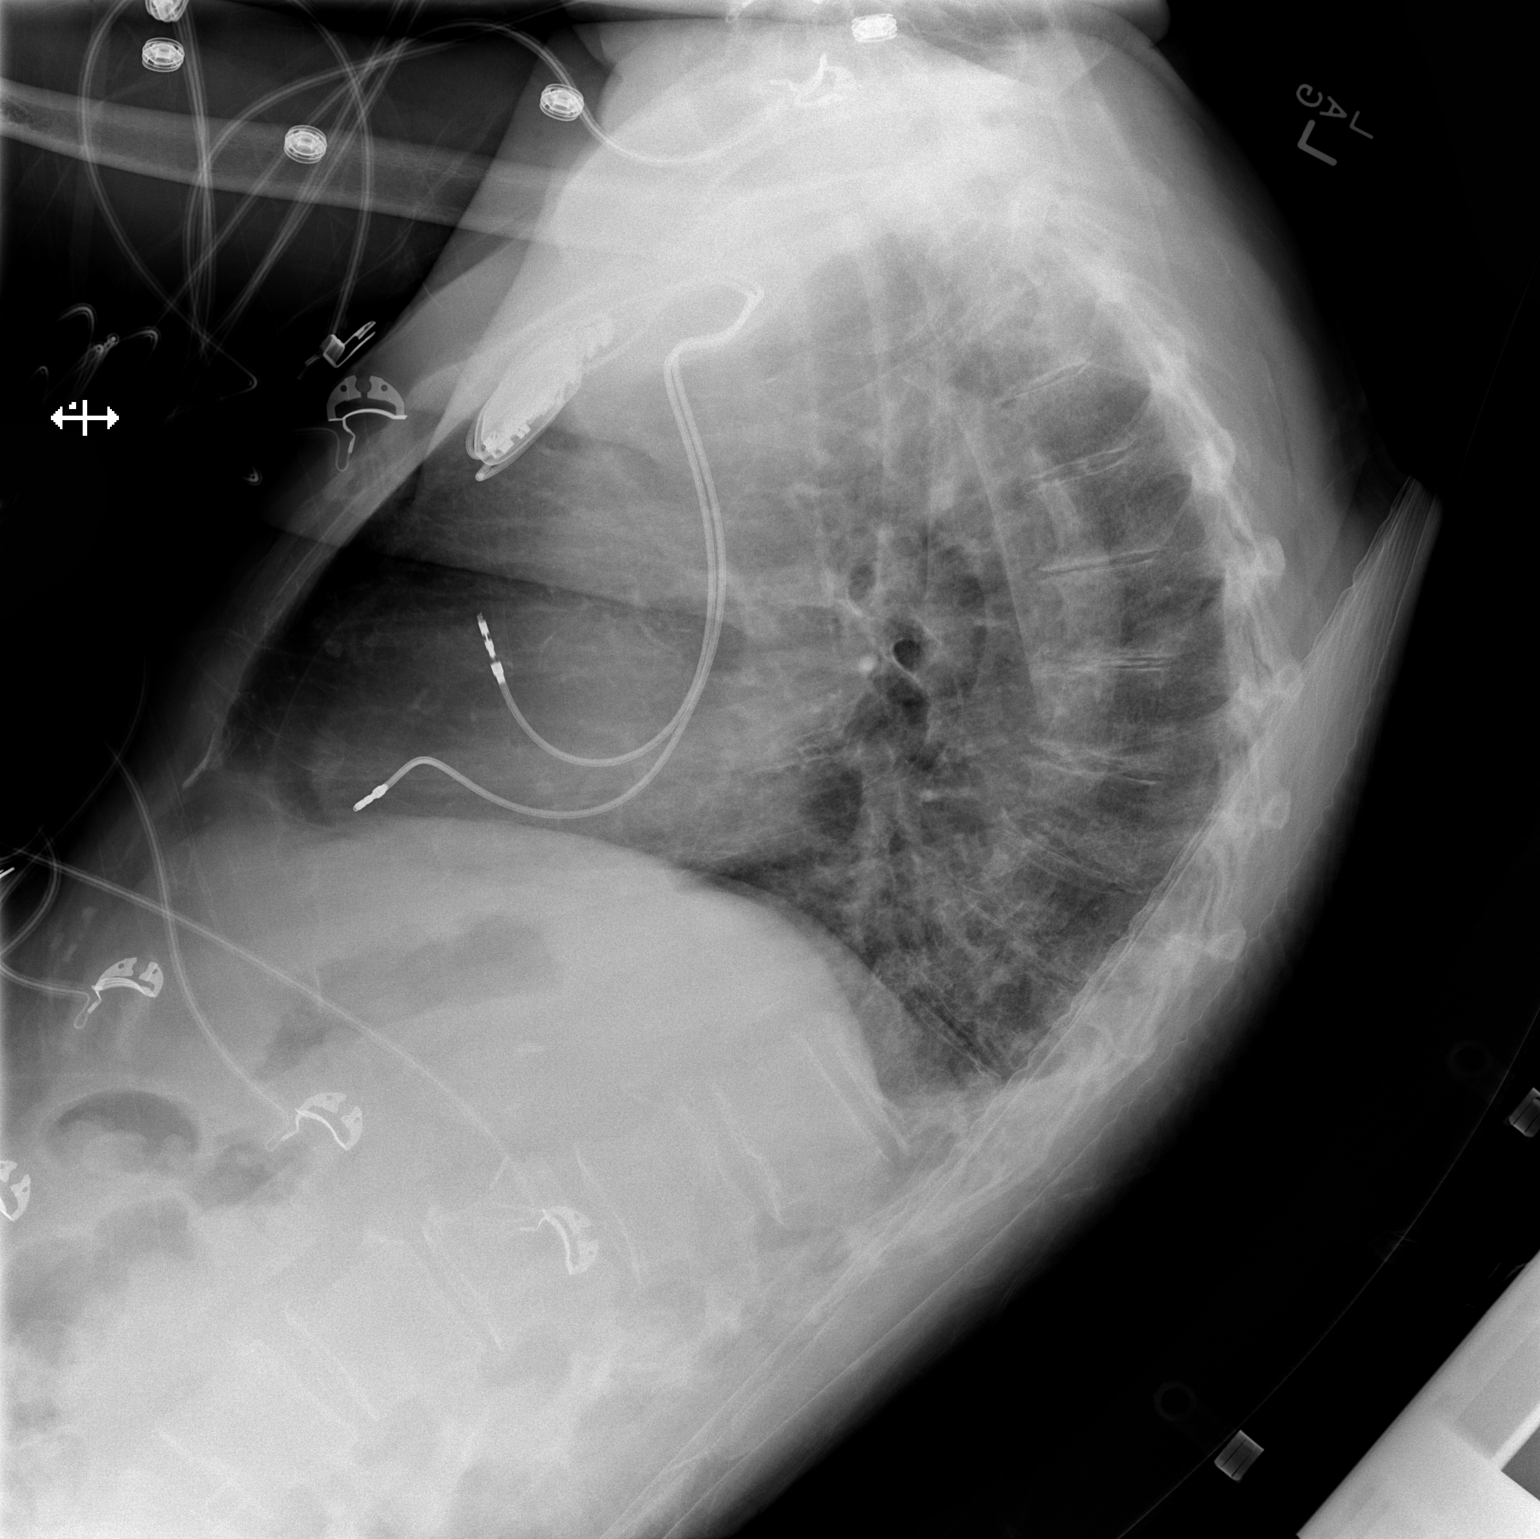

[x chest ap]
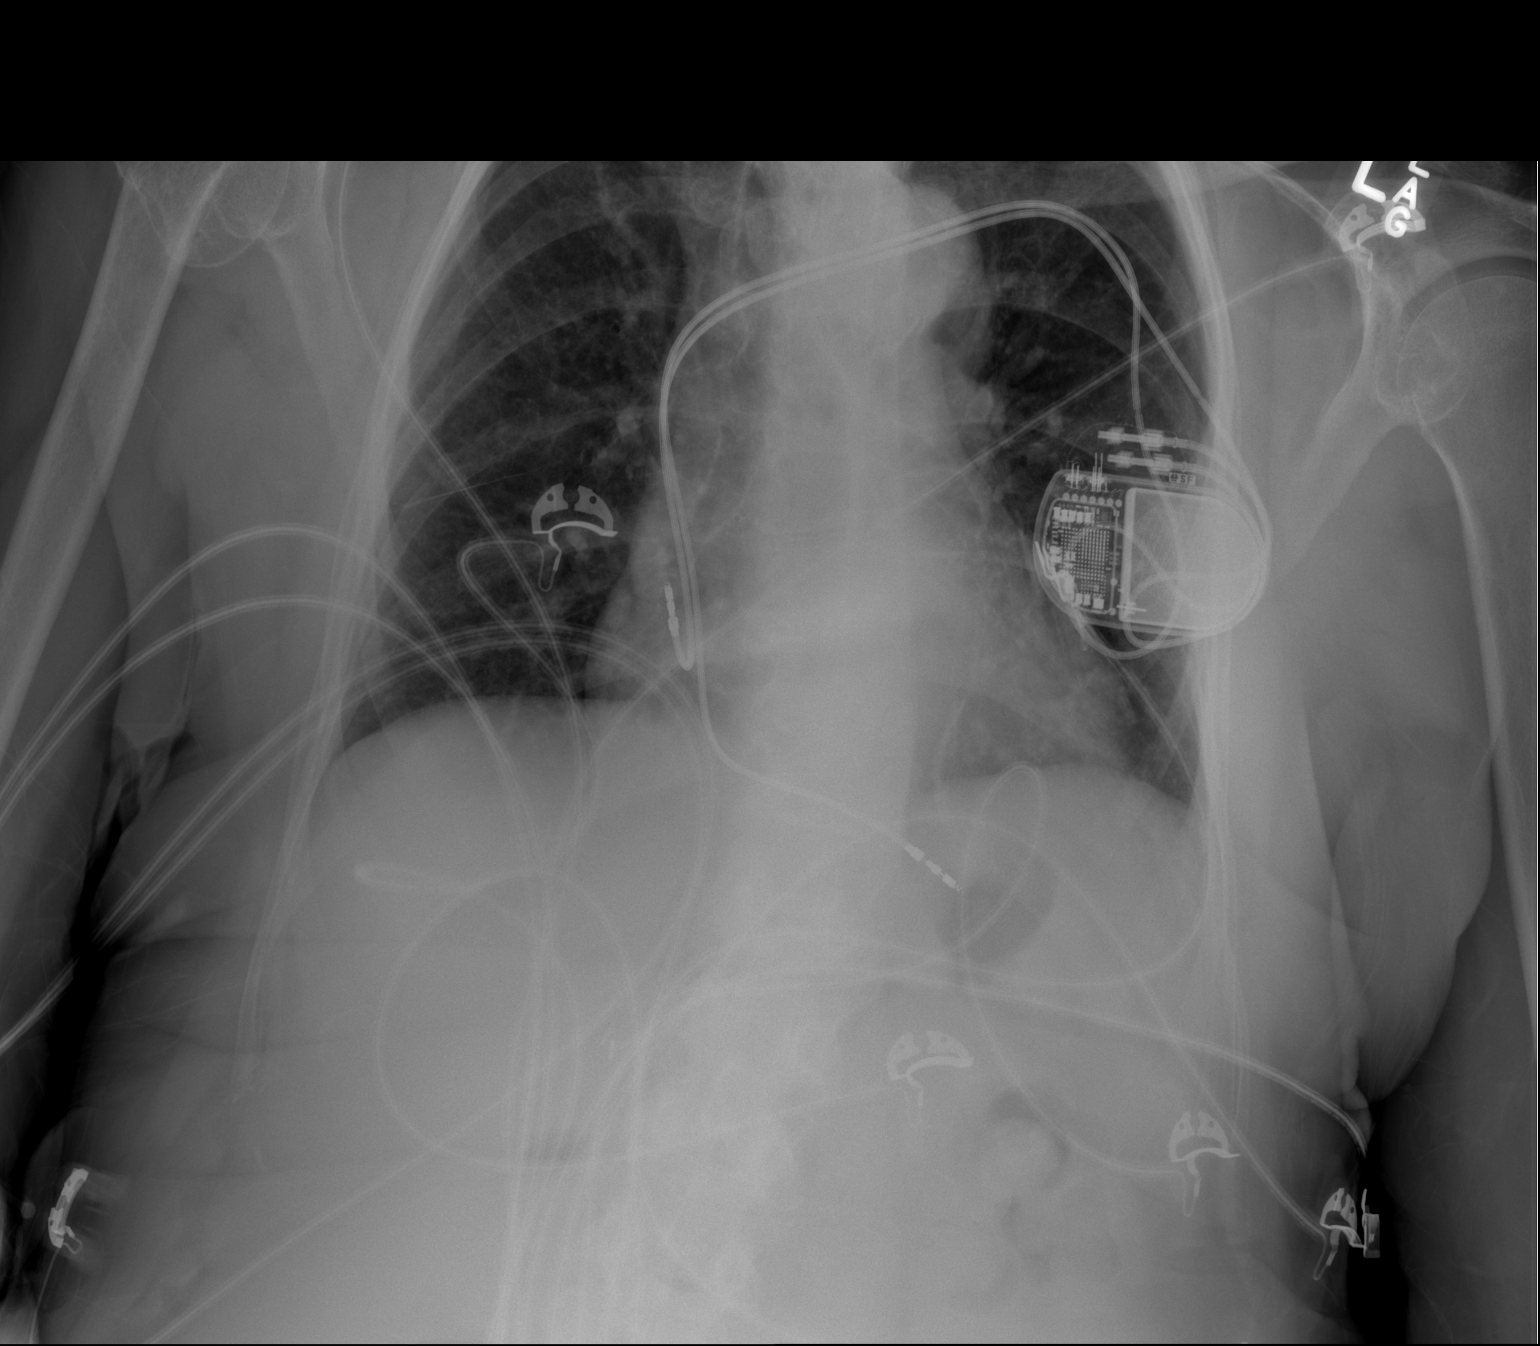

[2 of 2 positions shown; findings below may reference images not displayed]

FINDINGS: Left-sided pacemaker overlies normal cardiac silhouette.
No effusion, infiltrate, or pneumothorax.  Degenerative changes of
the spine and without acute findings are present.  There is
bronchitic markings centrally unchanged from prior.
IMPRESSION: No acute cardiopulmonary process.

## 2015-02-13 IMAGING — CT CT ABD-PELV W/ CM
2 of 5 series · 4 of 46 positions shown, 6 images · IV contrast (Iodine)
Comparison: CT-ABD AND PELVIS W/O CONTRAST dated 11/03/2011

CLINICAL DATA: Abdominal pain, altered mental status.

EXAM:
CT ABDOMEN AND PELVIS WITH CONTRAST
TECHNIQUE: Multidetector CT imaging of the abdomen and pelvis was performed
using the standard protocol following bolus administration of
intravenous contrast.
CONTRAST:  80mL OMNIPAQUE IOHEXOL 300 MG/ML  SOLN

[Series 206: coronals · coronal · 0.50mm/px · 3 of 84 slices shown, 4 images]
[im 19/84  soft-tissue]
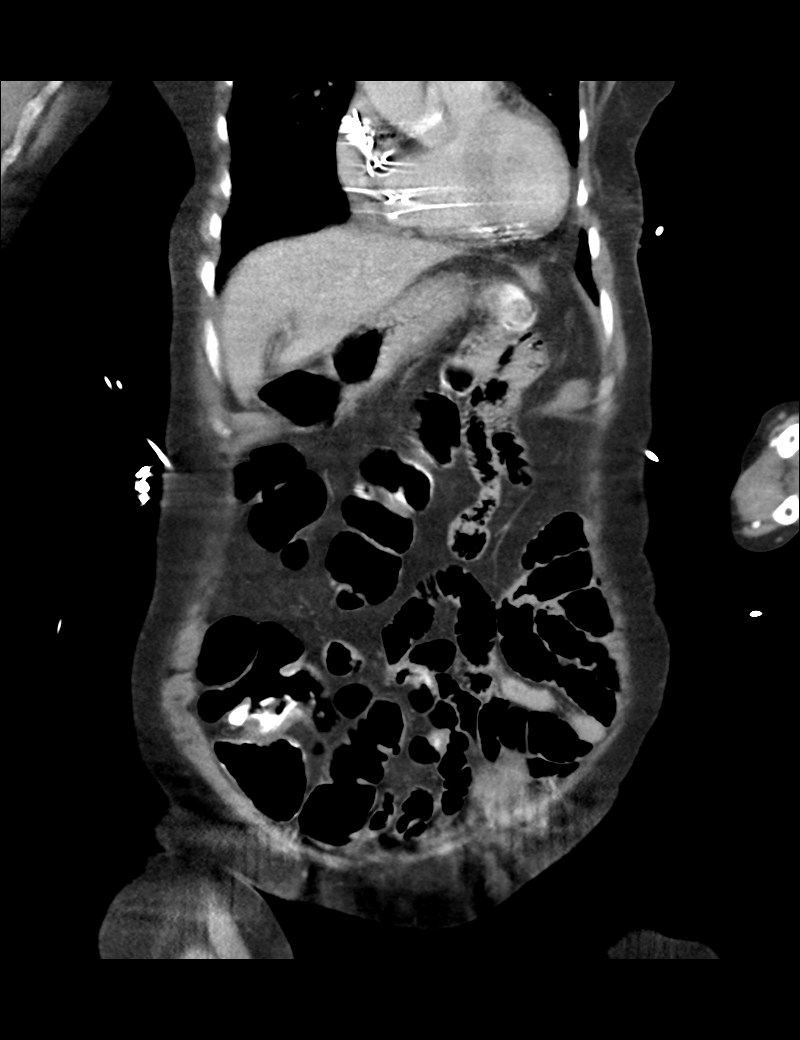
[im 19/84  bone]
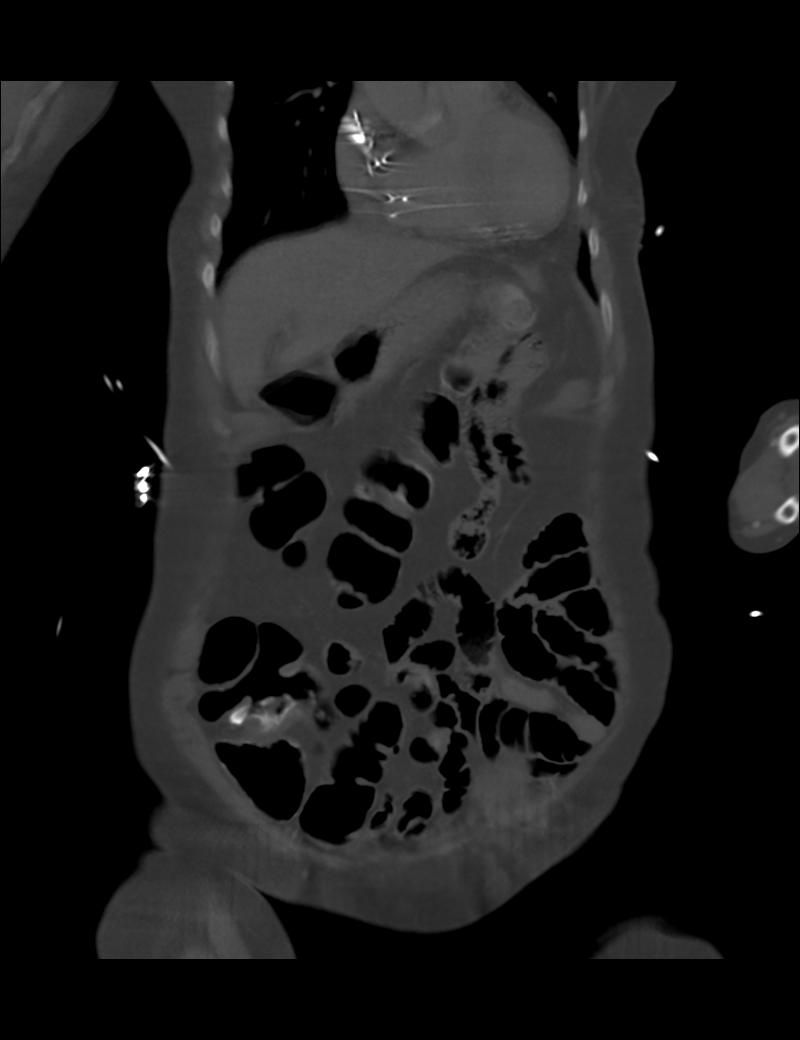
[im 47/84  soft-tissue]
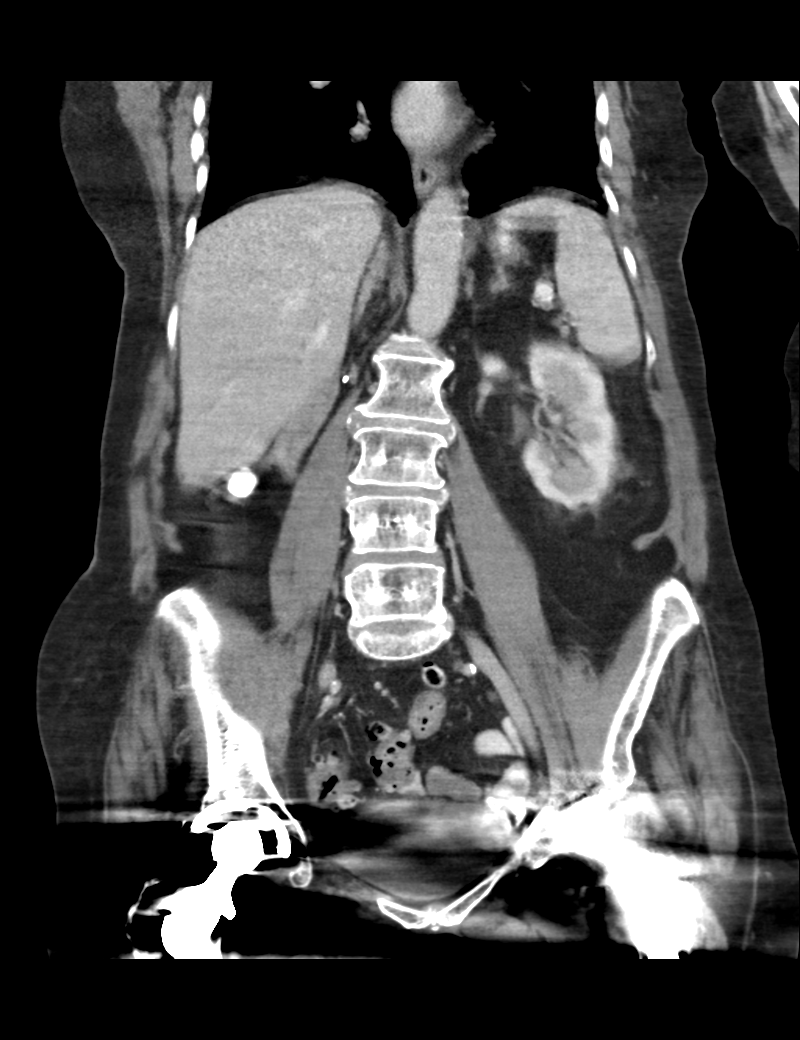
[im 65/84  soft-tissue]
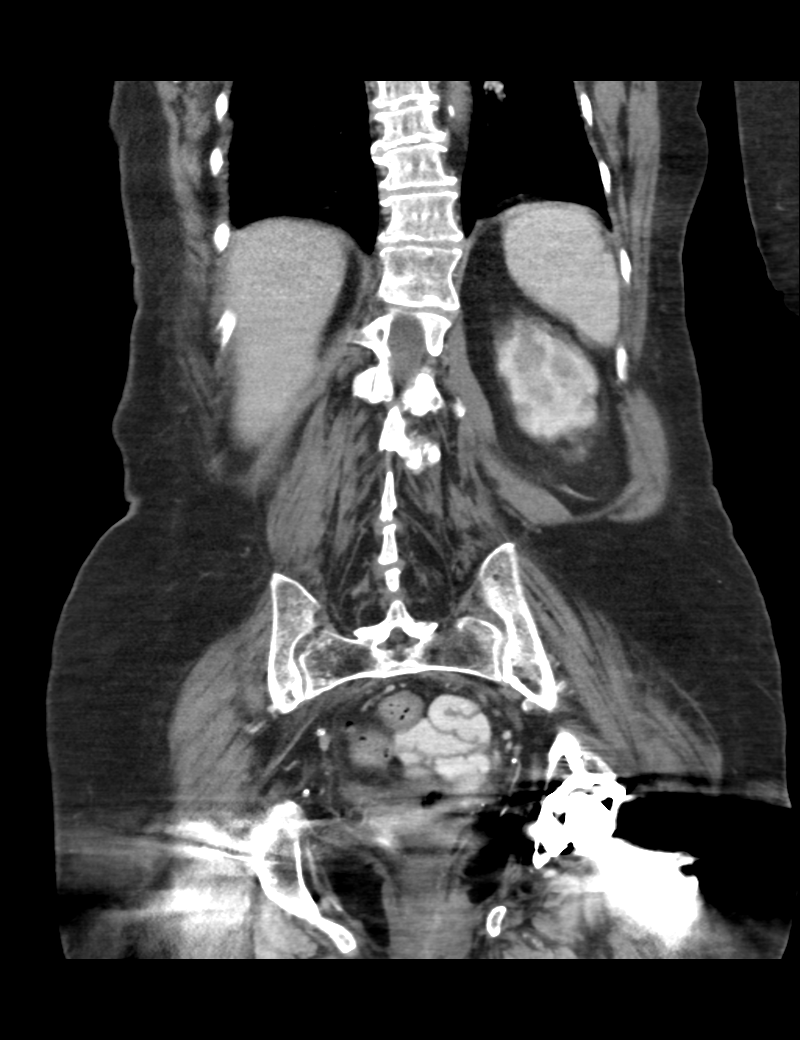

[Series 207: sagittals · sagittal · 0.50mm/px · 1 of 121 slices shown, 2 images]
[im 41/121  soft-tissue]
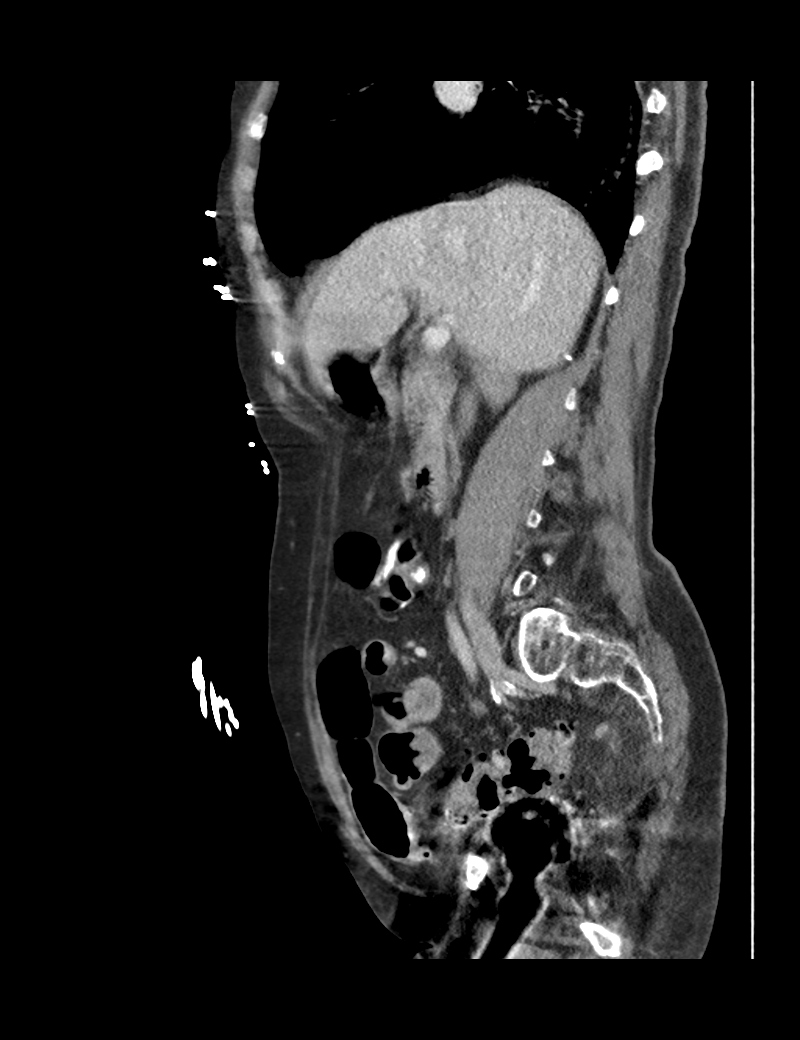
[im 41/121  bone]
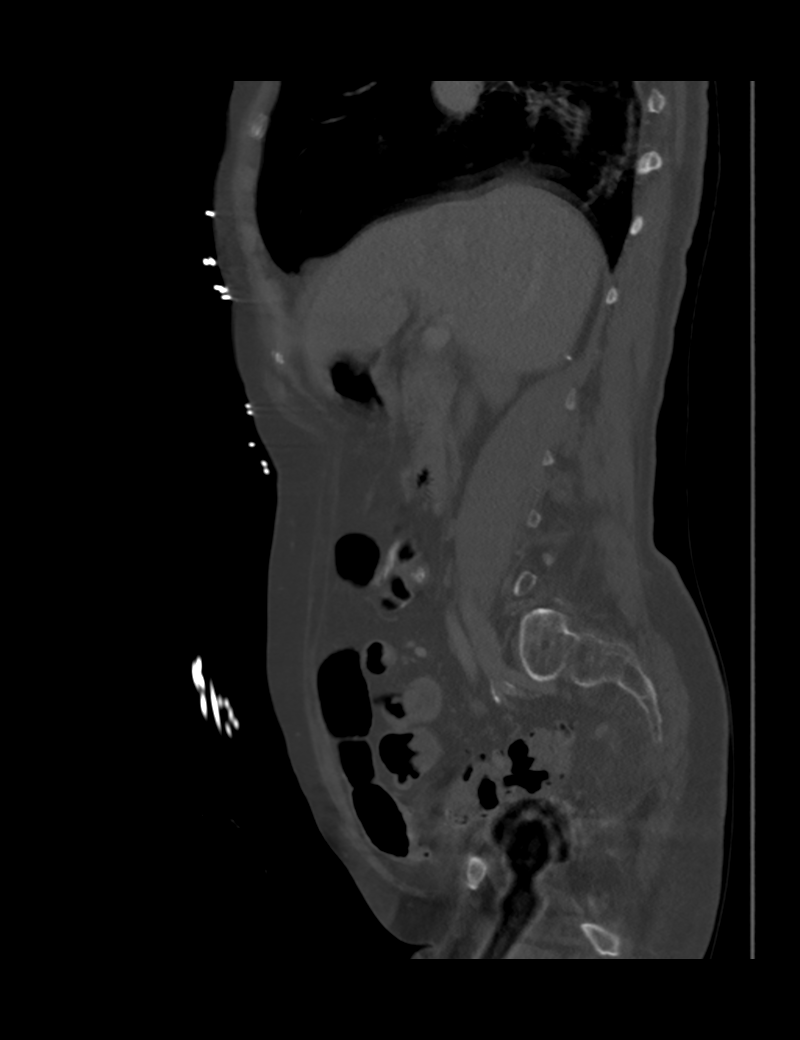

[4 of 46 positions shown; findings below may reference images not displayed]

FINDINGS: Mild motion degraded examination.

Limited view of the lung bases demonstrates mild pleural thickening,
dependent atelectasis. Heart size is mildly enlarged, pacer wires in
place. Included pericardium is nonsuspicious. Remote right posterior
tenth rib fracture.

The liver, spleen, right adrenal gland are unremarkable. Similar
nodular thickening of the left adrenal gland. Atrophic pancreas.
Gallbladder may be surgically absent.

Small hiatal hernia. Stomach, small and large bowel are normal in
course and caliber without inflammatory changes. Sigmoid
diverticulosis. Mild amount of retained large bowel stool. Limited
assessment of the distal bowel and pelvis due to streak artifact
from bilateral hip arthroplasties. No intraperitoneal free fluid nor
free air.

Status post right nephrectomy. Compensatory left nephromegaly
without hydronephrosis or nephrolithiasis. Too small to characterize
hypodensities in the left kidney, in addition to multiple cysts,
measuring up to mm in left lower pole. No perinephric collections.
Bladder diverticulum. Limited evaluation of the bladder and internal
reproductive organs due to streak artifact from bilateral hip
arthroplasties. Aortoiliac vessels are normal course and caliber
with mild calcific atherosclerosis.

Fatty replaced right rectus abdominus muscle. Osteopenia. Moderate
degenerative change of the included thoracic spine.
IMPRESSION: Motion degraded examination without acute intra-abdominal nor pelvic
process.

Status post suspected cholecystectomy and right nephrectomy.

  By: Giesinger Predic
